# Patient Record
Sex: Female | Born: 1965 | Race: Black or African American | Hispanic: No | Marital: Single | Smoking: Current every day smoker
Health system: Southern US, Community
[De-identification: ages and names within clinical notes are randomized; demographics above are authoritative.]

## PROBLEM LIST (undated history)

## (undated) DIAGNOSIS — R7303 Prediabetes: Secondary | ICD-10-CM

## (undated) DIAGNOSIS — G43909 Migraine, unspecified, not intractable, without status migrainosus: Secondary | ICD-10-CM

## (undated) DIAGNOSIS — I10 Essential (primary) hypertension: Secondary | ICD-10-CM

## (undated) DIAGNOSIS — H5440 Blindness, one eye, unspecified eye: Secondary | ICD-10-CM

## (undated) DIAGNOSIS — S42309A Unspecified fracture of shaft of humerus, unspecified arm, initial encounter for closed fracture: Secondary | ICD-10-CM

## (undated) DIAGNOSIS — F17200 Nicotine dependence, unspecified, uncomplicated: Secondary | ICD-10-CM

## (undated) HISTORY — PX: EYE SURGERY: SHX253

---

## 1997-10-16 ENCOUNTER — Emergency Department (HOSPITAL_COMMUNITY): Admission: EM | Admit: 1997-10-16 | Discharge: 1997-10-16 | Payer: Self-pay | Admitting: Emergency Medicine

## 1997-12-07 ENCOUNTER — Emergency Department (HOSPITAL_COMMUNITY): Admission: EM | Admit: 1997-12-07 | Discharge: 1997-12-07 | Payer: Self-pay | Admitting: *Deleted

## 1998-02-06 ENCOUNTER — Encounter: Payer: Self-pay | Admitting: Emergency Medicine

## 1998-02-06 ENCOUNTER — Emergency Department (HOSPITAL_COMMUNITY): Admission: EM | Admit: 1998-02-06 | Discharge: 1998-02-06 | Payer: Self-pay | Admitting: Emergency Medicine

## 2000-01-24 ENCOUNTER — Emergency Department (HOSPITAL_COMMUNITY): Admission: EM | Admit: 2000-01-24 | Discharge: 2000-01-24 | Payer: Self-pay | Admitting: Emergency Medicine

## 2000-08-23 ENCOUNTER — Encounter: Payer: Self-pay | Admitting: Emergency Medicine

## 2000-08-23 ENCOUNTER — Emergency Department (HOSPITAL_COMMUNITY): Admission: AC | Admit: 2000-08-23 | Discharge: 2000-08-23 | Payer: Self-pay | Admitting: Emergency Medicine

## 2002-05-05 ENCOUNTER — Emergency Department (HOSPITAL_COMMUNITY): Admission: EM | Admit: 2002-05-05 | Discharge: 2002-05-05 | Payer: Self-pay | Admitting: Emergency Medicine

## 2002-11-17 ENCOUNTER — Emergency Department (HOSPITAL_COMMUNITY): Admission: AD | Admit: 2002-11-17 | Discharge: 2002-11-17 | Payer: Self-pay

## 2003-08-30 ENCOUNTER — Emergency Department (HOSPITAL_COMMUNITY): Admission: EM | Admit: 2003-08-30 | Discharge: 2003-08-30 | Payer: Self-pay | Admitting: Emergency Medicine

## 2004-01-22 ENCOUNTER — Emergency Department (HOSPITAL_COMMUNITY): Admission: EM | Admit: 2004-01-22 | Discharge: 2004-01-22 | Payer: Self-pay | Admitting: Emergency Medicine

## 2005-02-06 ENCOUNTER — Encounter: Admission: RE | Admit: 2005-02-06 | Discharge: 2005-02-06 | Payer: Self-pay | Admitting: Internal Medicine

## 2005-09-29 ENCOUNTER — Emergency Department (HOSPITAL_COMMUNITY): Admission: EM | Admit: 2005-09-29 | Discharge: 2005-09-29 | Payer: Self-pay | Admitting: Emergency Medicine

## 2010-05-11 ENCOUNTER — Encounter: Payer: Self-pay | Admitting: Internal Medicine

## 2011-09-29 ENCOUNTER — Emergency Department (HOSPITAL_COMMUNITY)
Admission: EM | Admit: 2011-09-29 | Discharge: 2011-09-30 | Disposition: A | Discharge status: Home / Self Care | Payer: Self-pay | Attending: Emergency Medicine | Admitting: Emergency Medicine

## 2011-09-29 ENCOUNTER — Encounter (HOSPITAL_COMMUNITY): Payer: Self-pay | Admitting: *Deleted

## 2011-09-29 DIAGNOSIS — E041 Nontoxic single thyroid nodule: Secondary | ICD-10-CM | POA: Insufficient documentation

## 2011-09-29 DIAGNOSIS — J029 Acute pharyngitis, unspecified: Secondary | ICD-10-CM | POA: Insufficient documentation

## 2011-09-29 DIAGNOSIS — E119 Type 2 diabetes mellitus without complications: Secondary | ICD-10-CM | POA: Insufficient documentation

## 2011-09-29 DIAGNOSIS — R1310 Dysphagia, unspecified: Secondary | ICD-10-CM | POA: Insufficient documentation

## 2011-09-29 MED ORDER — MORPHINE SULFATE 2 MG/ML IJ SOLN
2.0000 mg | Freq: Once | INTRAMUSCULAR | Status: AC
  Administered 2011-09-30: 2 mg via INTRAVENOUS
  Filled 2011-09-29: qty 1

## 2011-09-29 NOTE — ED Provider Notes (Signed)
History     CSN: [id number]  Arrival date & time 09/29/11  2035   None     Chief Complaint  Patient presents with  . Sore Throat    (Consider location/radiation/quality/duration/timing/severity/associated sxs/prior treatment) HPI  For 46 year old female with history of DM and a CC of 18 hour history of sore throat. She's had a moderate cough as well which is nonproductive. No sick contacts. Low-grade fevers at home. The left side of her throat hurts much more right side of her throat. It hurts to swallow. Pain with swallowing as 10/10. It hurts 5/10 when not swallowing. She is able to eat and drink. No trouble breathing.  Several hours before presentation she lost her voice. She denies any nausea or vomiting. She denies any headache or vision change. Denies any changes in hearing. Calls illness moderate. Nothing makes it better or worse. Temperature 98.3, pulse 96, respirations 18, blood pressure 147/92, saturation 97%.  Past Medical History  Diagnosis Date  . Diabetes mellitus     History reviewed. No pertinent past surgical history.  No family history on file.  History  Substance Use Topics  . Smoking status: Current Everyday Smoker  . Smokeless tobacco: Not on file  . Alcohol Use: Yes    OB History    Grav Para Term Preterm Abortions TAB SAB Ect Mult Living                  Review of Systems Constitutional: Negative for fever and chills.  HENT: Negative for ear pain, POS sore throat and trouble swallowing.   Eyes: Negative for pain and visual disturbance.  Respiratory: POS  for mild cough and neg shortness of breath.   Cardiovascular: Negative for chest pain and leg swelling.  Gastrointestinal: Negative for nausea, vomiting, abdominal pain and diarrhea.  Genitourinary: Negative for dysuria, urgency and frequency.  Musculoskeletal: Negative for back pain and joint swelling.  Skin: Negative for rash and wound.  Neurological: Negative for dizziness, syncope, speech  difficulty, weakness and numbness.    Allergies  Review of patient's allergies indicates no known allergies.  Home Medications   Current Outpatient Rx  Name Route Sig Dispense Refill  . DOXYCYCLINE HYCLATE 100 MG PO CAPS Oral Take 1 capsule (100 mg total) by mouth 2 (two) times daily. 20 capsule 0  . HYDROCODONE-ACETAMINOPHEN 5-500 MG PO TABS Oral Take 1 tablet by mouth every 6 (six) hours as needed for pain. 5 tablet 0  . PREDNISONE 10 MG PO TABS Oral Take 5 tablets (50 mg total) by mouth daily. 15 tablet 0    BP 131/92  Pulse 81  Temp(Src) 99.5 F (37.5 C) (Oral)  Resp 18  SpO2 100%  LMP 09/23/2011  Physical Exam Consitutional: Pt in no acute distress.   Head: Normocephalic and atraumatic.  Eyes: Extraocular motion intact, no scleral icterus OPA: Very mild hint of fullness of the left soft palate. There is asymmetry to the erythema of her tonsillar pillars as well. Uvula is midline. Neck: Supple without meningismus, mass, or overt JVDVery subtle swelling of the left anterior neck, which is TTP.  Respiratory: Effort normal and breath sounds normal. No respiratory distress. CV: Heart regular rate and rhythm, no obvious murmurs.  Pulses +2 and symmetric Abdomen: Soft, non-tender, non-distended MSK: Extremities are atraumatic without deformity, ROM intact Skin: Warm, dry, intact Neuro: Alert and oriented, no motor deficit noted.  PERRL.  Psychiatric: Mood and affect are normal     ED Course  Procedures (including  critical care time)  Labs Reviewed  CBC - Abnormal; Notable for the following:    WBC 10.9 (*)    RBC 3.60 (*)    Hemoglobin 11.8 (*)    HCT 34.3 (*)    All other components within normal limits  DIFFERENTIAL - Abnormal; Notable for the following:    Monocytes Relative 14 (*)    Monocytes Absolute 1.5 (*)    All other components within normal limits  RAPID STREP SCREEN  BASIC METABOLIC PANEL   Ct Soft Tissue Neck W Contrast  09/30/2011  *RADIOLOGY  REPORT*  Clinical Data: Difficulty swallowing.  CT NECK WITH CONTRAST  Technique:  Multidetector CT imaging of the neck was performed with intravenous contrast.  Contrast:  75 ml Omnipaque-300  Comparison: None.  Findings: Limited images through the posterior fossa show no acute abnormality. Linear density along the lateral margin of the left globe is presumably postsurgical. Lung apices are clear.  Left tonsillar enlargement and peritonsillar stranding, with spread to the left lingual tonsil where there is a subcentimeter peripheral enhancing fluid collection on image 34 of series 4. The process spreads laterally to involve left parapharyngeal and carotid spaces. There is associated mass effect on the oropharyngeal airway.  The left vallecula and piriform sinus is obliterated.  There are prominent anterior cervical chain and submandibular lymph nodes.  Subcentimeter nodule within the right lobe the thyroid gland.  The jugular vein becomes quite diminutive as it courses posterior to the styloid process.  There are prominent collateral vessels stranding the carotid artery, asymmetric on the left.  No complete vessel occlusion identified.  No acute osseous finding. Degenerative disc disease centered at C5- 6.  IMPRESSION: Infiltrative tonsillar/peritonsillar process as described above, with mass effect on the oropharyngeal airway and obliteration of the left valleculae and piriform sinus. Infiltration extends into the left parapharyngeal and carotid spaces.  This may reflect an advanced infectious process involving multiple spaces.  However, I cannot exclude malignancy. Recommend ENT consultation.  More focal loculated fluid collection in the region of the lingual tonsil may represent an abscess.  Prominent lymph nodes, left greater than right.  Discussed via telephone with Dr. Rainey Pines at 02:00 a.m. on 09/30/2011. I will have a neuroradiologist review the case as well in the a.m. and addend if necessary.  Original  Report Authenticated By: Waneta Martins, M.D.     1. Sore throat       MDM  Patient is afebrile upon exam however she has lost her voice we have to assume this is likely a bronchitis. However of concern is there is a very very mild fullness of the left soft palate.  Given subtle fullness of l L neck, concern for abscess. We'll complete CT scan of the neck to rule out abscess in that area unlikely the patient on antibiotics as well for presumed infection.  Despite her very minimal external exam findings, CT scan demonstrates a large area of infiltrative disease of the several neck structures including the lingual tonsil, peritonsillar structures.  Her airway remains completely intact with no stridor or sense of shortness of SOB per the patient.   ENT consulted by phone. Differential diagnosis includes neoplasm versus infection. Patient will be started on oral antibiotics oral steroids he'll followup with ENT within a week. Patient given Decadron and Unasyn here. Patient to followup with prednisone and doxycycline until seen in the next several days by Dr. Emeline Darling. Given strict return precautions.  PT DC home stable.  Discussed with  pt the clinical impression, treatment in the ED, and follow up plan.  We alslo discussed the indications for returning to the ED, which include shortness or breath, confusion, fever, new weakness or numbness, chest pain, or any other concerning symptom.  The pt understood the treatment and plan, is stable, and is able to leave the ED.            Larrie Kass, MD 09/30/11 [phone]

## 2011-09-29 NOTE — ED Notes (Signed)
She has ha sorethroat since yesterday.  With some lt face swelling

## 2011-09-29 NOTE — ED Notes (Addendum)
Pt presented to ED with sore throat and tenderness since 2 days.No complaints of chills or rigor.Pt complains of difficulty in swallowing.Oral tem of 99.5.She also complaints of headache in the left side.

## 2011-09-30 ENCOUNTER — Emergency Department (HOSPITAL_COMMUNITY): Payer: Self-pay

## 2011-09-30 LAB — BASIC METABOLIC PANEL
BUN: 8 mg/dL (ref 6–23)
CO2: 22 mEq/L (ref 19–32)
Calcium: 9.1 mg/dL (ref 8.4–10.5)
Chloride: 101 mEq/L (ref 96–112)
Glucose, Bld: 87 mg/dL (ref 70–99)

## 2011-09-30 LAB — CBC
HCT: 34.3 % — ABNORMAL LOW (ref 36.0–46.0)
Hemoglobin: 11.8 g/dL — ABNORMAL LOW (ref 12.0–15.0)
WBC: 10.9 10*3/uL — ABNORMAL HIGH (ref 4.0–10.5)

## 2011-09-30 LAB — DIFFERENTIAL
Lymphocytes Relative: 16 % (ref 12–46)
Lymphs Abs: 1.8 10*3/uL (ref 0.7–4.0)
Monocytes Absolute: 1.5 10*3/uL — ABNORMAL HIGH (ref 0.1–1.0)
Monocytes Relative: 14 % — ABNORMAL HIGH (ref 3–12)
Neutro Abs: 7.5 10*3/uL (ref 1.7–7.7)

## 2011-09-30 MED ORDER — HYDROCODONE-ACETAMINOPHEN 5-500 MG PO TABS: 1.0000 | ORAL_TABLET | Freq: Four times a day (QID) | ORAL | Status: AC | PRN

## 2011-09-30 MED ORDER — SODIUM CHLORIDE 0.9 % IV SOLN
3.0000 g | Freq: Once | INTRAVENOUS | Status: AC
  Administered 2011-09-30: 3 g via INTRAVENOUS
  Filled 2011-09-30: qty 3

## 2011-09-30 MED ORDER — IOHEXOL 300 MG/ML  SOLN: 80.0000 mL | Freq: Once | INTRAMUSCULAR | Status: DC | PRN

## 2011-09-30 MED ORDER — DOXYCYCLINE HYCLATE 100 MG PO CAPS: 100.0000 mg | ORAL_CAPSULE | Freq: Two times a day (BID) | ORAL | Status: AC

## 2011-09-30 MED ORDER — DEXAMETHASONE SODIUM PHOSPHATE 10 MG/ML IJ SOLN
10.0000 mg | Freq: Once | INTRAMUSCULAR | Status: AC
  Administered 2011-09-30: 10 mg via INTRAVENOUS
  Filled 2011-09-30: qty 1

## 2011-09-30 MED ORDER — PREDNISONE 10 MG PO TABS: 50.0000 mg | ORAL_TABLET | Freq: Every day | ORAL | Status: AC

## 2011-09-30 MED ORDER — OXYCODONE-ACETAMINOPHEN 5-325 MG PO TABS
2.0000 | ORAL_TABLET | Freq: Once | ORAL | Status: AC
  Administered 2011-09-30: 2 via ORAL
  Filled 2011-09-30: qty 2

## 2011-09-30 NOTE — ED Notes (Signed)
Complaining of pain with swallowing. Left side face swelling

## 2011-09-30 NOTE — Discharge Instructions (Signed)
You need to follow up with ENT Dr. Emeline Darling to better figure out and treat what is causing your sore throat.  THIS IS IMPORTANT.  Take all of your medicines as directed.  Return to the emergency department with any difficulty breathing or strange sounds while breathing.  See your doctor immediately--or return to the ED--with any new or troubling symptoms including fevers, weakness, new chest pain, shortness or breath, numbness, or any other concerning symptom.  Sore Throat    Sore throats may be caused by bacteria and viruses. They may also be caused by:  Smoking.   Pollution.   Allergies.  If a sore throat is due to strep infection (a bacterial infection), you may need:  A throat swab.   A culture test to verify the strep infection.  You will need one of these:  An antibiotic shot.   Oral medicine for a full 10 days.  Strep infection is very contagious. A doctor should check any close contacts who have a sore throat or fever. A sore throat caused by a virus infection will usually last only 3-4 days. Antibiotics will not treat a viral sore throat.  Infectious mononucleosis (a viral disease), however, can cause a sore throat that lasts for up to 3 weeks. Mononucleosis can be diagnosed with blood tests. You must have been sick for at least 1 week in order for the test to give accurate results. HOME CARE INSTRUCTIONS   To treat a sore throat, take mild pain medicine.   Increase your fluids.   Eat a soft diet.   Do not smoke.   Gargling with warm water or salt water (1 tsp. salt in 8 oz. water) can be helpful.   Try throat sprays or lozenges or sucking on hard candy to ease the symptoms.  Call your doctor if your sore throat lasts longer than 1 week.  SEEK IMMEDIATE MEDICAL CARE IF:  You have difficulty breathing.   You have increased swelling in the throat.   You have pain so severe that you are unable to swallow fluids or your saliva.   You have a severe headache, a high fever,  vomiting, or a red rash.  Document Released: 05/15/2004 Document Revised: 03/27/2011 Document Reviewed: 03/25/2007 [facility] Patient Information 2012 [place], [place].

## 2011-10-03 NOTE — ED Provider Notes (Signed)
.  Face to face Exam:  General:  A&Ox3 HEENT:  Atraumatic Resp:  Normal effort Abd:  Nondistended Neuro:No focal deficits     Nelia Shi, MD 10/03/11 1112

## 2013-06-22 ENCOUNTER — Encounter (HOSPITAL_COMMUNITY): Payer: Self-pay | Admitting: Emergency Medicine

## 2013-06-22 ENCOUNTER — Emergency Department (HOSPITAL_COMMUNITY)
Admission: EM | Admit: 2013-06-22 | Discharge: 2013-06-22 | Disposition: A | Discharge status: Home / Self Care | Payer: Self-pay | Attending: Emergency Medicine | Admitting: Emergency Medicine

## 2013-06-22 DIAGNOSIS — R51 Headache: Secondary | ICD-10-CM | POA: Insufficient documentation

## 2013-06-22 DIAGNOSIS — R519 Headache, unspecified: Secondary | ICD-10-CM

## 2013-06-22 DIAGNOSIS — F17200 Nicotine dependence, unspecified, uncomplicated: Secondary | ICD-10-CM | POA: Insufficient documentation

## 2013-06-22 DIAGNOSIS — E119 Type 2 diabetes mellitus without complications: Secondary | ICD-10-CM | POA: Insufficient documentation

## 2013-06-22 DIAGNOSIS — Z8679 Personal history of other diseases of the circulatory system: Secondary | ICD-10-CM | POA: Insufficient documentation

## 2013-06-22 MED ORDER — METOCLOPRAMIDE HCL 5 MG/ML IJ SOLN
10.0000 mg | Freq: Once | INTRAMUSCULAR | Status: AC
  Administered 2013-06-22: 10 mg via INTRAMUSCULAR
  Filled 2013-06-22: qty 2

## 2013-06-22 MED ORDER — DIPHENHYDRAMINE HCL 50 MG/ML IJ SOLN
25.0000 mg | Freq: Once | INTRAMUSCULAR | Status: AC
Start: 2013-06-22 — End: 2013-06-22
  Administered 2013-06-22: 25 mg via INTRAMUSCULAR
  Filled 2013-06-22: qty 1

## 2013-06-22 MED ORDER — KETOROLAC TROMETHAMINE 60 MG/2ML IM SOLN
60.0000 mg | Freq: Once | INTRAMUSCULAR | Status: AC
Start: 2013-06-22 — End: 2013-06-22
  Administered 2013-06-22: 60 mg via INTRAMUSCULAR
  Filled 2013-06-22: qty 2

## 2013-06-22 NOTE — ED Provider Notes (Signed)
Medical screening examination/treatment/procedure(s) were performed by non-physician practitioner and as supervising physician I was immediately available for consultation/collaboration.   EKG Interpretation None      Devoria AlbeIva Melyna Huron, MD, Armando GangFACEP   Ward GivensIva L Marti Mclane, MD 06/22/13 [phone]

## 2013-06-22 NOTE — ED Notes (Signed)
Left ear pain that hurts up neck to eye since yesterday

## 2013-06-22 NOTE — Discharge Instructions (Signed)
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
## 2013-06-27 ENCOUNTER — Emergency Department (HOSPITAL_COMMUNITY): Payer: Self-pay

## 2013-06-27 ENCOUNTER — Emergency Department (HOSPITAL_COMMUNITY)
Admission: EM | Admit: 2013-06-27 | Discharge: 2013-06-27 | Disposition: A | Discharge status: Home / Self Care | Payer: Self-pay | Attending: Emergency Medicine | Admitting: Emergency Medicine

## 2013-06-27 ENCOUNTER — Encounter (HOSPITAL_COMMUNITY): Payer: Self-pay | Admitting: Emergency Medicine

## 2013-06-27 DIAGNOSIS — F17200 Nicotine dependence, unspecified, uncomplicated: Secondary | ICD-10-CM | POA: Insufficient documentation

## 2013-06-27 DIAGNOSIS — H5789 Other specified disorders of eye and adnexa: Secondary | ICD-10-CM | POA: Insufficient documentation

## 2013-06-27 DIAGNOSIS — R51 Headache: Secondary | ICD-10-CM | POA: Insufficient documentation

## 2013-06-27 DIAGNOSIS — E119 Type 2 diabetes mellitus without complications: Secondary | ICD-10-CM | POA: Insufficient documentation

## 2013-06-27 DIAGNOSIS — R519 Headache, unspecified: Secondary | ICD-10-CM

## 2013-06-27 DIAGNOSIS — Z8679 Personal history of other diseases of the circulatory system: Secondary | ICD-10-CM | POA: Insufficient documentation

## 2013-06-27 HISTORY — DX: Migraine, unspecified, not intractable, without status migrainosus: G43.909

## 2013-06-27 LAB — SEDIMENTATION RATE: SED RATE: 19 mm/h (ref 0–22)

## 2013-06-27 MED ORDER — HYDROMORPHONE HCL PF 1 MG/ML IJ SOLN
0.5000 mg | Freq: Once | INTRAMUSCULAR | Status: AC
  Administered 2013-06-27: 0.5 mg via INTRAVENOUS
  Filled 2013-06-27: qty 1

## 2013-06-27 MED ORDER — HYDROCODONE-ACETAMINOPHEN 5-325 MG PO TABS: 1.0000 | ORAL_TABLET | ORAL | Status: DC | PRN

## 2013-06-27 MED ORDER — ONDANSETRON HCL 4 MG/2ML IJ SOLN
4.0000 mg | Freq: Once | INTRAMUSCULAR | Status: AC
  Administered 2013-06-27: 4 mg via INTRAVENOUS
  Filled 2013-06-27: qty 2

## 2013-06-27 MED ORDER — KETOROLAC TROMETHAMINE 15 MG/ML IJ SOLN
INTRAMUSCULAR | Status: DC
Start: 2013-06-27 — End: 2013-06-27
  Filled 2013-06-27: qty 2

## 2013-06-27 MED ORDER — KETOROLAC TROMETHAMINE 15 MG/ML IJ SOLN
30.0000 mg | Freq: Once | INTRAMUSCULAR | Status: AC
  Administered 2013-06-27: 30 mg via INTRAVENOUS

## 2013-06-27 MED ORDER — METOCLOPRAMIDE HCL 5 MG/ML IJ SOLN
10.0000 mg | Freq: Once | INTRAMUSCULAR | Status: AC
Start: 2013-06-27 — End: 2013-06-27
  Administered 2013-06-27: 10 mg via INTRAVENOUS
  Filled 2013-06-27: qty 2

## 2013-06-27 MED ORDER — DIPHENHYDRAMINE HCL 50 MG/ML IJ SOLN
12.5000 mg | Freq: Once | INTRAMUSCULAR | Status: AC
  Administered 2013-06-27: 12.5 mg via INTRAVENOUS
  Filled 2013-06-27: qty 1

## 2013-06-27 NOTE — ED Notes (Signed)
Pt is c/o of headache in her left temple and behind left eye.  Pt rates pain 10/10 throbbing in nature. +photophobia.  Pt states pain started 06/26/13 at 2000.  Pt states she took Excedrin at 2300 w/o relief.  Pt is A&O x3.
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
## 2013-06-27 NOTE — ED Provider Notes (Signed)
Medical screening examination/treatment/procedure(s) were performed by non-physician practitioner and as supervising physician I was immediately available for consultation/collaboration.  Maribeth Jiles L Annastacia Duba, MD 06/27/13 1635 

## 2013-06-27 NOTE — Progress Notes (Signed)
[facility] provided pt with a list of primary care resources and a [facility] [employer] Card application to help patient establish primary care.  °

## 2013-06-27 NOTE — Discharge Instructions (Signed)

## 2013-06-27 NOTE — ED Notes (Signed)
Patient transported to CT 

## 2013-08-23 ENCOUNTER — Ambulatory Visit: Payer: Self-pay | Attending: Internal Medicine

## 2015-01-24 ENCOUNTER — Encounter (HOSPITAL_COMMUNITY): Payer: Self-pay | Admitting: Emergency Medicine

## 2015-01-24 ENCOUNTER — Emergency Department (HOSPITAL_COMMUNITY)
Admission: EM | Admit: 2015-01-24 | Discharge: 2015-01-24 | Disposition: A | Discharge status: Home / Self Care | Payer: Self-pay | Attending: Emergency Medicine | Admitting: Emergency Medicine

## 2015-01-24 DIAGNOSIS — G43909 Migraine, unspecified, not intractable, without status migrainosus: Secondary | ICD-10-CM | POA: Insufficient documentation

## 2015-01-24 DIAGNOSIS — H109 Unspecified conjunctivitis: Secondary | ICD-10-CM

## 2015-01-24 DIAGNOSIS — Z720 Tobacco use: Secondary | ICD-10-CM | POA: Insufficient documentation

## 2015-01-24 DIAGNOSIS — Z87828 Personal history of other (healed) physical injury and trauma: Secondary | ICD-10-CM | POA: Insufficient documentation

## 2015-01-24 DIAGNOSIS — E119 Type 2 diabetes mellitus without complications: Secondary | ICD-10-CM | POA: Insufficient documentation

## 2015-01-24 DIAGNOSIS — H5442 Blindness, left eye, normal vision right eye: Secondary | ICD-10-CM | POA: Insufficient documentation

## 2015-01-24 HISTORY — DX: Blindness, one eye, unspecified eye: H54.40

## 2015-01-24 MED ORDER — ERYTHROMYCIN 5 MG/GM OP OINT: 1.0000 "application " | TOPICAL_OINTMENT | Freq: Four times a day (QID) | OPHTHALMIC | Status: DC
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
## 2015-01-24 NOTE — ED Notes (Signed)
Pt. woke up this morning with left eye redness , itchy/painful with drainage . Denies injury.

## 2015-01-24 NOTE — Discharge Instructions (Signed)
Conjunctivitis is very contagious. Try to avoid rubbing eyes. Apply warm compresses and use prescribed eye ointment as directed. Follow up with your eye doctor in 2-3 days. Return to the ER for changes or worsening symptoms   Bacterial Conjunctivitis Bacterial conjunctivitis, commonly called pink eye, is an inflammation of the clear membrane that covers the white part of the eye (conjunctiva). The inflammation can also happen on the underside of the eyelids. The blood vessels in the conjunctiva become inflamed, causing the eye to become red or pink. Bacterial conjunctivitis may spread easily from one eye to another and from person to person (contagious).  CAUSES  Bacterial conjunctivitis is caused by bacteria. The bacteria may come from your own skin, your upper respiratory tract, or from someone else with bacterial conjunctivitis. SYMPTOMS  The normally white color of the eye or the underside of the eyelid is usually pink or red. The pink eye is usually associated with irritation, tearing, and some sensitivity to light. Bacterial conjunctivitis is often associated with a thick, yellowish discharge from the eye. The discharge may turn into a crust on the eyelids overnight, which causes your eyelids to stick together. If a discharge is present, there may also be some blurred vision in the affected eye. DIAGNOSIS  Bacterial conjunctivitis is diagnosed by your caregiver through an eye exam and the symptoms that you report. Your caregiver looks for changes in the surface tissues of your eyes, which may point to the specific type of conjunctivitis. A sample of any discharge may be collected on a cotton-tip swab if you have a severe case of conjunctivitis, if your cornea is affected, or if you keep getting repeat infections that do not respond to treatment. The sample will be sent to a lab to see if the inflammation is caused by a bacterial infection and to see if the infection will respond to antibiotic  medicines. TREATMENT   Bacterial conjunctivitis is treated with antibiotics. Antibiotic eyedrops are most often used. However, antibiotic ointments are also available. Antibiotics pills are sometimes used. Artificial tears or eye washes may ease discomfort. HOME CARE INSTRUCTIONS   To ease discomfort, apply a cool, clean washcloth to your eye for 10-20 minutes, 3-4 times a day.  Gently wipe away any drainage from your eye with a warm, wet washcloth or a cotton ball.  Wash your hands often with soap and water. Use paper towels to dry your hands.  Do not share towels or washcloths. This may spread the infection.  Change or wash your pillowcase every day.  You should not use eye makeup until the infection is gone.  Do not operate machinery or drive if your vision is blurred.  Stop using contact lenses. Ask your caregiver how to sterilize or replace your contacts before using them again. This depends on the type of contact lenses that you use.  When applying medicine to the infected eye, do not touch the edge of your eyelid with the eyedrop bottle or ointment tube. SEEK IMMEDIATE MEDICAL CARE IF:   Your infection has not improved within 3 days after beginning treatment.  You had yellow discharge from your eye and it returns.  You have increased eye pain.  Your eye redness is spreading.  Your vision becomes blurred.  You have a fever or persistent symptoms for more than 2-3 days.  You have a fever and your symptoms suddenly get worse.  You have facial pain, redness, or swelling. MAKE SURE YOU:   Understand these instructions.  Will  watch your condition.  Will get help right away if you are not doing well or get worse.   This information is not intended to replace advice given to you by your health care provider. Make sure you discuss any questions you have with your health care provider.   Document Released: 04/07/2005 Document Revised: 04/28/2014 Document Reviewed:  09/08/2011 Elsevier Interactive Patient Education [employer]

## 2015-12-08 ENCOUNTER — Emergency Department (HOSPITAL_COMMUNITY): Payer: Self-pay

## 2015-12-08 ENCOUNTER — Encounter (HOSPITAL_COMMUNITY): Payer: Self-pay | Admitting: Emergency Medicine

## 2015-12-08 ENCOUNTER — Emergency Department (HOSPITAL_COMMUNITY)
Admission: EM | Admit: 2015-12-08 | Discharge: 2015-12-08 | Disposition: A | Discharge status: Home / Self Care | Payer: Self-pay | Attending: Emergency Medicine | Admitting: Emergency Medicine

## 2015-12-08 DIAGNOSIS — R1032 Left lower quadrant pain: Secondary | ICD-10-CM | POA: Insufficient documentation

## 2015-12-08 DIAGNOSIS — E119 Type 2 diabetes mellitus without complications: Secondary | ICD-10-CM | POA: Insufficient documentation

## 2015-12-08 DIAGNOSIS — F17210 Nicotine dependence, cigarettes, uncomplicated: Secondary | ICD-10-CM | POA: Insufficient documentation

## 2015-12-08 DIAGNOSIS — Z7982 Long term (current) use of aspirin: Secondary | ICD-10-CM | POA: Insufficient documentation

## 2015-12-08 DIAGNOSIS — K529 Noninfective gastroenteritis and colitis, unspecified: Secondary | ICD-10-CM | POA: Insufficient documentation

## 2015-12-08 LAB — COMPREHENSIVE METABOLIC PANEL
ALK PHOS: 54 U/L (ref 38–126)
ALT: 26 U/L (ref 14–54)
AST: 22 U/L (ref 15–41)
Albumin: 3.1 g/dL — ABNORMAL LOW (ref 3.5–5.0)
Anion gap: 10 (ref 5–15)
CALCIUM: 8.5 mg/dL — AB (ref 8.9–10.3)
CHLORIDE: 101 mmol/L (ref 101–111)
CO2: 23 mmol/L (ref 22–32)
CREATININE: 0.62 mg/dL (ref 0.44–1.00)
GFR calc Af Amer: >60 mL/min (ref >60)
Glucose, Bld: 88 mg/dL (ref 65–99)
Potassium: 2.8 mmol/L — ABNORMAL LOW (ref 3.5–5.1)
Sodium: 134 mmol/L — ABNORMAL LOW (ref 135–145)
Total Bilirubin: 0.7 mg/dL (ref 0.3–1.2)
Total Protein: 6.4 g/dL — ABNORMAL LOW (ref 6.5–8.1)

## 2015-12-08 LAB — CBC
HCT: 36.8 % (ref 36.0–46.0)
Hemoglobin: 12.4 g/dL (ref 12.0–15.0)
MCH: 31.7 pg (ref 26.0–34.0)
MCHC: 33.7 g/dL (ref 30.0–36.0)
MCV: 94.1 fL (ref 78.0–100.0)
PLATELETS: 394 10*3/uL (ref 150–400)
RBC: 3.91 MIL/uL (ref 3.87–5.11)
RDW: 13.7 % (ref 11.5–15.5)
WBC: 10 10*3/uL (ref 4.0–10.5)

## 2015-12-08 LAB — URINALYSIS, ROUTINE W REFLEX MICROSCOPIC
GLUCOSE, UA: NEGATIVE mg/dL
HGB URINE DIPSTICK: NEGATIVE
Nitrite: NEGATIVE
PROTEIN: 30 mg/dL — AB
Specific Gravity, Urine: 1.035 — ABNORMAL HIGH (ref 1.005–1.030)
pH: 6 (ref 5.0–8.0)

## 2015-12-08 LAB — URINE MICROSCOPIC-ADD ON: RBC / HPF: NONE SEEN RBC/hpf (ref 0–5)

## 2015-12-08 LAB — LIPASE, BLOOD: LIPASE: 15 U/L (ref 11–51)

## 2015-12-08 MED ORDER — METRONIDAZOLE 500 MG PO TABS: 500.0000 mg | ORAL_TABLET | Freq: Two times a day (BID) | ORAL | 0 refills | Status: DC

## 2015-12-08 MED ORDER — MAGNESIUM OXIDE 400 (241.3 MG) MG PO TABS
800.0000 mg | ORAL_TABLET | Freq: Once | ORAL | Status: AC
  Administered 2015-12-08: 800 mg via ORAL
  Filled 2015-12-08: qty 2

## 2015-12-08 MED ORDER — SODIUM CHLORIDE 0.9 % IV BOLUS (SEPSIS)
1000.0000 mL | Freq: Once | INTRAVENOUS | Status: AC
  Administered 2015-12-08: 1000 mL via INTRAVENOUS

## 2015-12-08 MED ORDER — CIPROFLOXACIN HCL 500 MG PO TABS: 500.0000 mg | ORAL_TABLET | Freq: Two times a day (BID) | ORAL | 0 refills | Status: DC

## 2015-12-08 MED ORDER — METRONIDAZOLE 500 MG PO TABS
500.0000 mg | ORAL_TABLET | Freq: Once | ORAL | Status: AC
  Administered 2015-12-08: 500 mg via ORAL
  Filled 2015-12-08: qty 1

## 2015-12-08 MED ORDER — CIPROFLOXACIN HCL 500 MG PO TABS
500.0000 mg | ORAL_TABLET | Freq: Once | ORAL | Status: AC
  Administered 2015-12-08: 500 mg via ORAL
  Filled 2015-12-08: qty 1

## 2015-12-08 MED ORDER — POTASSIUM CHLORIDE 10 MEQ/100ML IV SOLN
10.0000 meq | Freq: Once | INTRAVENOUS | Status: AC
  Administered 2015-12-08: 10 meq via INTRAVENOUS
  Filled 2015-12-08: qty 100

## 2015-12-08 MED ORDER — POTASSIUM CHLORIDE CRYS ER 20 MEQ PO TBCR
80.0000 meq | EXTENDED_RELEASE_TABLET | Freq: Once | ORAL | Status: AC
  Administered 2015-12-08: 80 meq via ORAL
  Filled 2015-12-08: qty 4

## 2015-12-08 MED ORDER — IOPAMIDOL (ISOVUE-300) INJECTION 61%
INTRAVENOUS | Status: AC
  Administered 2015-12-08: 100 mL
  Filled 2015-12-08: qty 100

## 2015-12-08 MED ORDER — MORPHINE SULFATE (PF) 4 MG/ML IV SOLN
4.0000 mg | Freq: Once | INTRAVENOUS | Status: AC
  Administered 2015-12-08: 4 mg via INTRAVENOUS
  Filled 2015-12-08: qty 1

## 2015-12-08 NOTE — ED Notes (Signed)
Pt dc home with mother via wc

## 2015-12-08 NOTE — ED Notes (Signed)
MD at bedside. 
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
## 2015-12-08 NOTE — ED Notes (Signed)
Pt back from CT

## 2015-12-08 NOTE — ED Notes (Signed)
Provider at bedside

## 2015-12-08 NOTE — ED Notes (Signed)
Pt to CT

## 2015-12-08 NOTE — ED Notes (Signed)
Pt unable to take PO meds at this time r/t nausea at this time.

## 2015-12-08 NOTE — ED Triage Notes (Signed)
Pt c/o diarrhea and weakness ongoing for about 2 weeks.

## 2015-12-26 ENCOUNTER — Encounter (HOSPITAL_COMMUNITY): Payer: Self-pay | Admitting: Emergency Medicine

## 2015-12-26 ENCOUNTER — Emergency Department (HOSPITAL_COMMUNITY): Payer: Self-pay

## 2015-12-26 ENCOUNTER — Emergency Department (HOSPITAL_COMMUNITY)
Admission: EM | Admit: 2015-12-26 | Discharge: 2015-12-26 | Disposition: A | Discharge status: Home / Self Care | Payer: Self-pay | Attending: Emergency Medicine | Admitting: Emergency Medicine

## 2015-12-26 DIAGNOSIS — K6289 Other specified diseases of anus and rectum: Secondary | ICD-10-CM | POA: Insufficient documentation

## 2015-12-26 DIAGNOSIS — R197 Diarrhea, unspecified: Secondary | ICD-10-CM

## 2015-12-26 DIAGNOSIS — E119 Type 2 diabetes mellitus without complications: Secondary | ICD-10-CM | POA: Insufficient documentation

## 2015-12-26 DIAGNOSIS — F17210 Nicotine dependence, cigarettes, uncomplicated: Secondary | ICD-10-CM | POA: Insufficient documentation

## 2015-12-26 DIAGNOSIS — Z7982 Long term (current) use of aspirin: Secondary | ICD-10-CM | POA: Insufficient documentation

## 2015-12-26 LAB — CBC
HCT: 39.1 % (ref 36.0–46.0)
HEMOGLOBIN: 13.3 g/dL (ref 12.0–15.0)
MCH: 32.3 pg (ref 26.0–34.0)
MCHC: 34 g/dL (ref 30.0–36.0)
MCV: 94.9 fL (ref 78.0–100.0)
Platelets: 351 10*3/uL (ref 150–400)
RBC: 4.12 MIL/uL (ref 3.87–5.11)
RDW: 14.4 % (ref 11.5–15.5)
WBC: 13.1 10*3/uL — AB (ref 4.0–10.5)

## 2015-12-26 LAB — URINALYSIS, ROUTINE W REFLEX MICROSCOPIC
Glucose, UA: NEGATIVE mg/dL
Hgb urine dipstick: NEGATIVE
Leukocytes, UA: NEGATIVE
NITRITE: NEGATIVE
Protein, ur: NEGATIVE mg/dL
Specific Gravity, Urine: 1.02 (ref 1.005–1.030)
pH: 7.5 (ref 5.0–8.0)

## 2015-12-26 LAB — COMPREHENSIVE METABOLIC PANEL
ALBUMIN: 3.5 g/dL (ref 3.5–5.0)
ALK PHOS: 59 U/L (ref 38–126)
ALT: 25 U/L (ref 14–54)
ANION GAP: 11 (ref 5–15)
AST: 15 U/L (ref 15–41)
BILIRUBIN TOTAL: 0.7 mg/dL (ref 0.3–1.2)
CALCIUM: 9.2 mg/dL (ref 8.9–10.3)
CO2: 25 mmol/L (ref 22–32)
Chloride: 101 mmol/L (ref 101–111)
Creatinine, Ser: 0.59 mg/dL (ref 0.44–1.00)
GFR calc Af Amer: >60 mL/min (ref >60)
GLUCOSE: 100 mg/dL — AB (ref 65–99)
Potassium: 3 mmol/L — ABNORMAL LOW (ref 3.5–5.1)
Sodium: 137 mmol/L (ref 135–145)
TOTAL PROTEIN: 7.1 g/dL (ref 6.5–8.1)

## 2015-12-26 LAB — I-STAT CG4 LACTIC ACID, ED
LACTIC ACID, VENOUS: 1.01 mmol/L (ref 0.5–1.9)
Lactic Acid, Venous: 1.1 mmol/L (ref 0.5–1.9)

## 2015-12-26 LAB — LIPASE, BLOOD: Lipase: 16 U/L (ref 11–51)

## 2015-12-26 MED ORDER — IOPAMIDOL (ISOVUE-300) INJECTION 61%
INTRAVENOUS | Status: AC
  Administered 2015-12-26: 100 mL
  Filled 2015-12-26: qty 100

## 2015-12-26 MED ORDER — HYDROCODONE-ACETAMINOPHEN 5-325 MG PO TABS: 1.0000 | ORAL_TABLET | ORAL | 0 refills | Status: DC | PRN

## 2015-12-26 MED ORDER — HYDROMORPHONE HCL 1 MG/ML IJ SOLN
1.0000 mg | Freq: Once | INTRAMUSCULAR | Status: AC
  Administered 2015-12-26: 1 mg via INTRAVENOUS
  Filled 2015-12-26: qty 1

## 2015-12-26 MED ORDER — METRONIDAZOLE 500 MG PO TABS: 500.0000 mg | ORAL_TABLET | Freq: Three times a day (TID) | ORAL | 0 refills | Status: AC

## 2015-12-26 MED ORDER — SODIUM CHLORIDE 0.9 % IV BOLUS (SEPSIS)
1000.0000 mL | Freq: Once | INTRAVENOUS | Status: AC
  Administered 2015-12-26: 1000 mL via INTRAVENOUS

## 2015-12-26 MED ORDER — PROBIOTIC 250 MG PO CAPS: 1.0000 | ORAL_CAPSULE | Freq: Every day | ORAL | 0 refills | Status: DC

## 2015-12-26 MED ORDER — ONDANSETRON HCL 4 MG PO TABS: 4.0000 mg | ORAL_TABLET | Freq: Four times a day (QID) | ORAL | 0 refills | Status: DC | PRN

## 2015-12-26 MED ORDER — ONDANSETRON HCL 4 MG/2ML IJ SOLN
4.0000 mg | Freq: Once | INTRAMUSCULAR | Status: AC
  Administered 2015-12-26: 4 mg via INTRAVENOUS
  Filled 2015-12-26: qty 2
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
## 2015-12-26 NOTE — ED Triage Notes (Signed)
Pt states she was seen here twow eeks ago and was told she had bacteria in her colon, was given antibiotics and took them all the way, she felt better but the symptoms came back again. Pt c/o diarrhea, stomach hurting, states she has poor appetite. Pt also c/o vomiting.

## 2015-12-26 NOTE — ED Notes (Signed)
MD at bedside. 

## 2015-12-26 NOTE — ED Notes (Signed)
Patient transported to CT 

## 2015-12-27 ENCOUNTER — Telehealth (HOSPITAL_COMMUNITY): Payer: Self-pay

## 2015-12-27 LAB — C DIFFICILE QUICK SCREEN W PCR REFLEX
C Diff antigen: NEGATIVE
C Diff toxin: POSITIVE — AB

## 2015-12-27 LAB — CLOSTRIDIUM DIFFICILE BY PCR: Toxigenic C. Difficile by PCR: POSITIVE — AB

## 2015-12-27 NOTE — Telephone Encounter (Signed)
Lab calling stating do not have enough stool for GI panel but have enough for C Diff.  Call transferred to ordering provider J. Cristi LoronBeatty Res. for further instruction

## 2016-01-08 ENCOUNTER — Ambulatory Visit: Payer: Self-pay | Attending: Internal Medicine | Admitting: Internal Medicine

## 2016-01-08 ENCOUNTER — Other Ambulatory Visit: Payer: Self-pay | Admitting: Pharmacist

## 2016-01-08 VITALS — BP 157/90 | HR 71 | Temp 99.1°F | Resp 16 | Wt 136.4 lb

## 2016-01-08 DIAGNOSIS — Z23 Encounter for immunization: Secondary | ICD-10-CM

## 2016-01-08 DIAGNOSIS — A0472 Enterocolitis due to Clostridium difficile, not specified as recurrent: Secondary | ICD-10-CM

## 2016-01-08 DIAGNOSIS — Z114 Encounter for screening for human immunodeficiency virus [HIV]: Secondary | ICD-10-CM | POA: Insufficient documentation

## 2016-01-08 DIAGNOSIS — F17200 Nicotine dependence, unspecified, uncomplicated: Secondary | ICD-10-CM

## 2016-01-08 DIAGNOSIS — A047 Enterocolitis due to Clostridium difficile: Secondary | ICD-10-CM

## 2016-01-08 DIAGNOSIS — Z1329 Encounter for screening for other suspected endocrine disorder: Secondary | ICD-10-CM | POA: Insufficient documentation

## 2016-01-08 DIAGNOSIS — Z1159 Encounter for screening for other viral diseases: Secondary | ICD-10-CM | POA: Insufficient documentation

## 2016-01-08 DIAGNOSIS — Z0000 Encounter for general adult medical examination without abnormal findings: Secondary | ICD-10-CM | POA: Insufficient documentation

## 2016-01-08 DIAGNOSIS — E876 Hypokalemia: Secondary | ICD-10-CM | POA: Insufficient documentation

## 2016-01-08 LAB — BASIC METABOLIC PANEL WITH GFR
BUN: 7 mg/dL (ref 7–25)
CO2: 25 mmol/L (ref 20–31)
Calcium: 9.1 mg/dL (ref 8.6–10.4)
Chloride: 105 mmol/L (ref 98–110)
Creat: 0.52 mg/dL (ref 0.50–1.05)
GFR, Est Non African American: >89 mL/min (ref >=60)
GLUCOSE: 83 mg/dL (ref 65–99)
POTASSIUM: 3.8 mmol/L (ref 3.5–5.3)
SODIUM: 137 mmol/L (ref 135–146)

## 2016-01-08 LAB — TSH: TSH: 0.99 mIU/L

## 2016-01-08 MED ORDER — HYDROCHLOROTHIAZIDE 12.5 MG PO TABS: 12.5000 mg | ORAL_TABLET | Freq: Every day | ORAL | 3 refills | Status: DC

## 2016-01-08 MED ORDER — AMITRIPTYLINE HCL 25 MG PO TABS: 25.0000 mg | ORAL_TABLET | Freq: Every day | ORAL | 2 refills | Status: DC
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
## 2016-01-08 NOTE — Progress Notes (Signed)
Pt is in today to establish care  Pt confirms pain  Pt states she is taking medications without any difficulties

## 2016-01-08 NOTE — Patient Instructions (Addendum)
Hypertension Hypertension is another name for high blood pressure. High blood pressure forces your heart to work harder to pump blood. A blood pressure reading has two numbers, which includes a higher number over a lower number (example: 110/72). HOME CARE   Have your blood pressure rechecked by your doctor.  Only take medicine as told by your doctor. Follow the directions carefully. The medicine does not work as well if you skip doses. Skipping doses also puts you at risk for problems.  Do not smoke.  Monitor your blood pressure at home as told by your doctor. GET HELP IF:  You think you are having a reaction to the medicine you are taking.  You have repeat headaches or feel dizzy.  You have puffiness (swelling) in your ankles.  You have trouble with your vision. GET HELP RIGHT AWAY IF:   You get a very bad headache and are confused.  You feel weak, numb, or faint.  You get chest or belly (abdominal) pain.  You throw up (vomit).  You cannot breathe very well. MAKE SURE YOU:   Understand these instructions.  Will watch your condition.  Will get help right away if you are not doing well or get worse.   This information is not intended to replace advice given to you by your health care provider. Make sure you discuss any questions you have with your health care provider.   Document Released: 09/24/2007 Document Revised: 04/12/2013 Document Reviewed: 01/28/2013 Elsevier Interactive Patient Education 2016 Elsevier Inc.  - DASH Eating Plan DASH stands for "Dietary Approaches to Stop Hypertension." The DASH eating plan is a healthy eating plan that has been shown to reduce high blood pressure (hypertension). Additional health benefits may include reducing the risk of type 2 diabetes mellitus, heart disease, and stroke. The DASH eating plan may also help with weight loss. WHAT DO I NEED TO KNOW ABOUT THE DASH EATING PLAN? For the DASH eating plan, you will follow these  general guidelines:  Choose foods with a percent daily value for sodium of less than 5% (as listed on the food label).  Use salt-free seasonings or herbs instead of table salt or sea salt.  Check with your health care provider or pharmacist before using salt substitutes.  Eat lower-sodium products, often labeled as "lower sodium" or "no salt added."  Eat fresh foods.  Eat more vegetables, fruits, and low-fat dairy products.  Choose whole grains. Look for the word "whole" as the first word in the ingredient list.  Choose fish and skinless chicken or turkey more often than red meat. Limit fish, poultry, and meat to 6 oz (170 g) each day.  Limit sweets, desserts, sugars, and sugary drinks.  Choose heart-healthy fats.  Limit cheese to 1 oz (28 g) per day.  Eat more home-cooked food and less restaurant, buffet, and fast food.  Limit fried foods.  Cook foods using methods other than frying.  Limit canned vegetables. If you do use them, rinse them well to decrease the sodium.  When eating at a restaurant, ask that your food be prepared with less salt, or no salt if possible. WHAT FOODS CAN I EAT? Seek help from a dietitian for individual calorie needs. Grains Whole grain or whole wheat bread. Brown rice. Whole grain or whole wheat pasta. Quinoa, bulgur, and whole grain cereals. Low-sodium cereals. Corn or whole wheat flour tortillas. Whole grain cornbread. Whole grain crackers. Low-sodium crackers. Vegetables Fresh or frozen vegetables (raw, steamed, roasted, or grilled). Low-sodium or   reduced-sodium tomato and vegetable juices. Low-sodium or reduced-sodium tomato sauce and paste. Low-sodium or reduced-sodium canned vegetables.  Fruits All fresh, canned (in natural juice), or frozen fruits. Meat and Other Protein Products Ground beef (85% or leaner), grass-fed beef, or beef trimmed of fat. Skinless chicken or [place]. Ground chicken or [place]. Pork trimmed of fat. All fish and  seafood. Eggs. Dried beans, peas, or lentils. Unsalted nuts and seeds. Unsalted canned beans. Dairy Low-fat dairy products, such as skim or 1% milk, 2% or reduced-fat cheeses, low-fat ricotta or cottage cheese, or plain low-fat yogurt. Low-sodium or reduced-sodium cheeses. Fats and Oils Tub margarines without trans fats. Light or reduced-fat mayonnaise and salad dressings (reduced sodium). Avocado. Safflower, olive, or canola oils. Natural peanut or almond butter. Other Unsalted popcorn and pretzels. The items listed above may not be a complete list of recommended foods or beverages. Contact your dietitian for more options. WHAT FOODS ARE NOT RECOMMENDED? Grains White bread. White pasta. White rice. Refined cornbread. Bagels and croissants. Crackers that contain trans fat. Vegetables Creamed or fried vegetables. Vegetables in a cheese sauce. Regular canned vegetables. Regular canned tomato sauce and paste. Regular tomato and vegetable juices. Fruits Dried fruits. Canned fruit in light or heavy syrup. Fruit juice. Meat and Other Protein Products Fatty cuts of meat. Ribs, chicken wings, bacon, sausage, bologna, salami, chitterlings, fatback, hot dogs, bratwurst, and packaged luncheon meats. Salted nuts and seeds. Canned beans with salt. Dairy Whole or 2% milk, cream, half-and-half, and cream cheese. Whole-fat or sweetened yogurt. Full-fat cheeses or blue cheese. Nondairy creamers and whipped toppings. Processed cheese, cheese spreads, or cheese curds. Condiments Onion and garlic salt, seasoned salt, table salt, and sea salt. Canned and packaged gravies. Worcestershire sauce. Tartar sauce. Barbecue sauce. Teriyaki sauce. Soy sauce, including reduced sodium. Steak sauce. Fish sauce. Oyster sauce. Cocktail sauce. Horseradish. Ketchup and mustard. Meat flavorings and tenderizers. Bouillon cubes. Hot sauce. Tabasco sauce. Marinades. Taco seasonings. Relishes. Fats and Oils Butter, stick margarine,  lard, shortening, ghee, and bacon fat. Coconut, palm kernel, or palm oils. Regular salad dressings. Other Pickles and olives. Salted popcorn and pretzels. The items listed above may not be a complete list of foods and beverages to avoid. Contact your dietitian for more information. WHERE CAN I FIND MORE INFORMATION? National Heart, Lung, and Blood Institute: [employer]   This information is not intended to replace advice given to you by your health care provider. Make sure you discuss any questions you have with your health care provider.   Document Released: 03/27/2011 Document Revised: 04/28/2014 Document Reviewed: 02/09/2013 Elsevier Interactive Patient Education 2016 [employer].  -  You Can Quit Smoking If you are ready to quit smoking or are thinking about it, congratulations! You have chosen to help yourself be healthier and live longer! There are lots of different ways to quit smoking. Nicotine gum, nicotine patches, a nicotine inhaler, or nicotine nasal spray can help with physical craving. Hypnosis, support groups, and medicines help break the habit of smoking. TIPS TO GET OFF AND STAY OFF CIGARETTES  Learn to predict your moods. Do not let a bad situation be your excuse to have a cigarette. Some situations in your life might tempt you to have a cigarette.  Ask friends and co-workers not to smoke around you.  Make your home smoke-free.  Never have "just one" cigarette. It leads to wanting another and another. Remind yourself of your decision to quit.  On a card, make a list of your reasons for not  smoking. Read it at least the same number of times a day as you have a cigarette. Tell yourself everyday, "I do not want to smoke. I choose not to smoke."  Ask someone at home or work to help you with your plan to quit smoking.  Have something planned after you eat or have a cup of coffee. Take a walk or get other exercise to perk you up.  This will help to keep you from overeating.  Try a relaxation exercise to calm you down and decrease your stress. Remember, you may be tense and nervous the first two weeks after you quit. This will pass.  Find new activities to keep your hands busy. Play with a pen, coin, or rubber band. Doodle or draw things on paper.  Brush your teeth right after eating. This will help cut down the craving for the taste of tobacco after meals. You can try mouthwash too.  Try gum, breath mints, or diet candy to keep something in your mouth. IF YOU SMOKE AND WANT TO QUIT:  Do not stock up on cigarettes. Never buy a carton. Wait until one pack is finished before you buy another.  Never carry cigarettes with you at work or at home.  Keep cigarettes as far away from you as possible. Leave them with someone else.  Never carry matches or a lighter with you.  Ask yourself, "Do I need this cigarette or is this just a reflex?"  Bet with someone that you can quit. Put cigarette money in a piggy bank every morning. If you smoke, you give up the money. If you do not smoke, by the end of the week, you keep the money.  Keep trying. It takes 21 days to change a habit!  Talk to your doctor about using medicines to help you quit. These include nicotine replacement gum, lozenges, or skin patches.   This information is not intended to replace advice given to you by your health care provider. Make sure you discuss any questions you have with your health care provider.   Document Released: 02/01/2009 Document Revised: 06/30/2011 Document Reviewed: 02/01/2009 Elsevier Interactive Patient Education [employer] Elsevier Inc.

## 2016-01-09 ENCOUNTER — Other Ambulatory Visit: Payer: Self-pay | Admitting: Internal Medicine

## 2016-01-09 LAB — HIV ANTIBODY (ROUTINE TESTING W REFLEX): HIV 1&2 Ab, 4th Generation: NONREACTIVE

## 2016-01-09 LAB — HEPATITIS C ANTIBODY: HCV AB: NEGATIVE

## 2016-01-09 LAB — VITAMIN D 25 HYDROXY (VIT D DEFICIENCY, FRACTURES): VIT D 25 HYDROXY: 14 ng/mL — AB (ref 30–100)

## 2016-01-09 MED ORDER — VITAMIN D (ERGOCALCIFEROL) 1.25 MG (50000 UNIT) PO CAPS: 50000.0000 [IU] | ORAL_CAPSULE | ORAL | 0 refills | Status: DC

## 2016-01-09 MED FILL — AMITRIPTYLINE HCL 25 MG TAB: 25 | 30 days supply | Qty: 30 | Fill #0

## 2016-01-09 MED FILL — VIT D2 1.25 MG (50,000 UNIT: 1.25 MG | 28 days supply | Qty: 4 | Fill #0

## 2016-01-11 ENCOUNTER — Telehealth: Payer: Self-pay

## 2016-01-11 NOTE — Telephone Encounter (Signed)
Contacted pt to go over lab results pt is aware of results and doesn't have any questions or concerns 

## 2016-01-16 ENCOUNTER — Telehealth: Payer: Self-pay

## 2016-01-16 NOTE — Telephone Encounter (Signed)
Pt returned call and went over lab results pt is aware and doesn't have any questions or concerns  

## 2016-01-16 NOTE — Telephone Encounter (Signed)
Contacted pt to go over lab results pt is unavailable this is the second attempt will be mailing results out today

## 2016-01-22 ENCOUNTER — Ambulatory Visit: Payer: Self-pay | Attending: Internal Medicine

## 2016-02-19 ENCOUNTER — Encounter: Payer: Self-pay | Admitting: Internal Medicine

## 2016-02-19 ENCOUNTER — Ambulatory Visit: Payer: Self-pay | Attending: Internal Medicine | Admitting: Internal Medicine

## 2016-02-19 VITALS — BP 154/96 | HR 76 | Temp 98.2°F | Resp 16 | Wt 141.2 lb

## 2016-02-19 DIAGNOSIS — E119 Type 2 diabetes mellitus without complications: Secondary | ICD-10-CM | POA: Insufficient documentation

## 2016-02-19 DIAGNOSIS — Z1231 Encounter for screening mammogram for malignant neoplasm of breast: Secondary | ICD-10-CM

## 2016-02-19 DIAGNOSIS — R51 Headache: Secondary | ICD-10-CM | POA: Insufficient documentation

## 2016-02-19 DIAGNOSIS — H538 Other visual disturbances: Secondary | ICD-10-CM

## 2016-02-19 DIAGNOSIS — R519 Headache, unspecified: Secondary | ICD-10-CM

## 2016-02-19 DIAGNOSIS — Z1239 Encounter for other screening for malignant neoplasm of breast: Secondary | ICD-10-CM

## 2016-02-19 DIAGNOSIS — Z01411 Encounter for gynecological examination (general) (routine) with abnormal findings: Secondary | ICD-10-CM | POA: Insufficient documentation

## 2016-02-19 DIAGNOSIS — Z124 Encounter for screening for malignant neoplasm of cervix: Secondary | ICD-10-CM

## 2016-02-19 DIAGNOSIS — I10 Essential (primary) hypertension: Secondary | ICD-10-CM | POA: Insufficient documentation

## 2016-02-19 DIAGNOSIS — Z1211 Encounter for screening for malignant neoplasm of colon: Secondary | ICD-10-CM

## 2016-02-19 DIAGNOSIS — Z7982 Long term (current) use of aspirin: Secondary | ICD-10-CM | POA: Insufficient documentation

## 2016-02-19 DIAGNOSIS — K029 Dental caries, unspecified: Secondary | ICD-10-CM | POA: Insufficient documentation

## 2016-02-19 DIAGNOSIS — Z79899 Other long term (current) drug therapy: Secondary | ICD-10-CM | POA: Insufficient documentation

## 2016-02-19 DIAGNOSIS — H5462 Unqualified visual loss, left eye, normal vision right eye: Secondary | ICD-10-CM | POA: Insufficient documentation

## 2016-02-19 DIAGNOSIS — G4700 Insomnia, unspecified: Secondary | ICD-10-CM | POA: Insufficient documentation

## 2016-02-19 MED ORDER — HYDROCHLOROTHIAZIDE 25 MG PO TABS: 25.0000 mg | ORAL_TABLET | Freq: Every day | ORAL | 3 refills | Status: DC

## 2016-02-19 MED ORDER — AMITRIPTYLINE HCL 50 MG PO TABS: 50.0000 mg | ORAL_TABLET | Freq: Every day | ORAL | 2 refills | Status: DC

## 2016-02-19 MED FILL — AMITRIPTYLINE HCL 50 MG TAB: 50 | 30 days supply | Qty: 30 | Fill #0

## 2016-02-19 MED FILL — HYDROCHLOROTHIAZIDE 25 MG T: 25 | 30 days supply | Qty: 30 | Fill #0
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
## 2016-02-19 NOTE — Progress Notes (Signed)
Pt is in the office today for a pap Pt states she is not in any pain Pt states she is taking medications without difficulty  

## 2016-02-19 NOTE — Patient Instructions (Signed)
Health Maintenance, Female Adopting a healthy lifestyle and getting preventive care can go a long way to promote health and wellness. Talk with your health care provider about what schedule of regular examinations is right for you. This is a good chance for you to check in with your provider about disease prevention and staying healthy. In between checkups, there are plenty of things you can do on your own. Experts have done a lot of research about which lifestyle changes and preventive measures are most likely to keep you healthy. Ask your health care provider for more information. WEIGHT AND DIET  Eat a healthy diet  Be sure to include plenty of vegetables, fruits, low-fat dairy products, and lean protein.  Do not eat a lot of foods high in solid fats, added sugars, or salt.  Get regular exercise. This is one of the most important things you can do for your health.  Most adults should exercise for at least 150 minutes each week. The exercise should increase your heart rate and make you sweat (moderate-intensity exercise).  Most adults should also do strengthening exercises at least twice a week. This is in addition to the moderate-intensity exercise.  Maintain a healthy weight  Body mass index (BMI) is a measurement that can be used to identify possible weight problems. It estimates body fat based on height and weight. Your health care provider can help determine your BMI and help you achieve or maintain a healthy weight.  For females 20 years of age and older:   A BMI below 18.5 is considered underweight.  A BMI of 18.5 to 24.9 is normal.  A BMI of 25 to 29.9 is considered overweight.  A BMI of 30 and above is considered obese.  Watch levels of cholesterol and blood lipids  You should start having your blood tested for lipids and cholesterol at 52 years of age, then have this test every 5 years.  You may need to have your cholesterol levels checked more often if:  Your lipid  or cholesterol levels are high.  You are older than 52 years of age.  You are at high risk for heart disease.  CANCER SCREENING   Lung Cancer  Lung cancer screening is recommended for adults 55-80 years old who are at high risk for lung cancer because of a history of smoking.  A yearly low-dose CT scan of the lungs is recommended for people who:  Currently smoke.  Have quit within the past 15 years.  Have at least a 30-pack-year history of smoking. A pack year is smoking an average of one pack of cigarettes a day for 1 year.  Yearly screening should continue until it has been 15 years since you quit.  Yearly screening should stop if you develop a health problem that would prevent you from having lung cancer treatment.  Breast Cancer  Practice breast self-awareness. This means understanding how your breasts normally appear and feel.  It also means doing regular breast self-exams. Let your health care provider know about any changes, no matter how small.  If you are in your 20s or 30s, you should have a clinical breast exam (CBE) by a health care provider every 1-3 years as part of a regular health exam.  If you are 40 or older, have a CBE every year. Also consider having a breast X-ray (mammogram) every year.  If you have a family history of breast cancer, talk to your health care provider about genetic screening.  If you   are at high risk for breast cancer, talk to your health care provider about having an MRI and a mammogram every year.  Breast cancer gene (BRCA) assessment is recommended for women who have family members with BRCA-related cancers. BRCA-related cancers include:  Breast.  Ovarian.  Tubal.  Peritoneal cancers.  Results of the assessment will determine the need for genetic counseling and BRCA1 and BRCA2 testing. Cervical Cancer Your health care provider may recommend that you be screened regularly for cancer of the pelvic organs (ovaries, uterus, and  vagina). This screening involves a pelvic examination, including checking for microscopic changes to the surface of your cervix (Pap test). You may be encouraged to have this screening done every 3 years, beginning at age 21.  For women ages 30-65, health care providers may recommend pelvic exams and Pap testing every 3 years, or they may recommend the Pap and pelvic exam, combined with testing for human papilloma virus (HPV), every 5 years. Some types of HPV increase your risk of cervical cancer. Testing for HPV may also be done on women of any age with unclear Pap test results.  Other health care providers may not recommend any screening for nonpregnant women who are considered low risk for pelvic cancer and who do not have symptoms. Ask your health care provider if a screening pelvic exam is right for you.  If you have had past treatment for cervical cancer or a condition that could lead to cancer, you need Pap tests and screening for cancer for at least 20 years after your treatment. If Pap tests have been discontinued, your risk factors (such as having a new sexual partner) need to be reassessed to determine if screening should resume. Some women have medical problems that increase the chance of getting cervical cancer. In these cases, your health care provider may recommend more frequent screening and Pap tests. Colorectal Cancer  This type of cancer can be detected and often prevented.  Routine colorectal cancer screening usually begins at 52 years of age and continues through 52 years of age.  Your health care provider may recommend screening at an earlier age if you have risk factors for colon cancer.  Your health care provider may also recommend using home test kits to check for hidden blood in the stool.  A small camera at the end of a tube can be used to examine your colon directly (sigmoidoscopy or colonoscopy). This is done to check for the earliest forms of colorectal  cancer.  Routine screening usually begins at age 50.  Direct examination of the colon should be repeated every 5-10 years through 52 years of age. However, you may need to be screened more often if early forms of precancerous polyps or small growths are found. Skin Cancer  Check your skin from head to toe regularly.  Tell your health care provider about any new moles or changes in moles, especially if there is a change in a mole's shape or color.  Also tell your health care provider if you have a mole that is larger than the size of a pencil eraser.  Always use sunscreen. Apply sunscreen liberally and repeatedly throughout the day.  Protect yourself by wearing long sleeves, pants, a wide-brimmed hat, and sunglasses whenever you are outside. HEART DISEASE, DIABETES, AND HIGH BLOOD PRESSURE   High blood pressure causes heart disease and increases the risk of stroke. High blood pressure is more likely to develop in:  People who have blood pressure in the high end   of the normal range (130-139/85-89 mm Hg).  People who are overweight or obese.  People who are African American.  If you are 38-23 years of age, have your blood pressure checked every 3-5 years. If you are 61 years of age or older, have your blood pressure checked every year. You should have your blood pressure measured twice--once when you are at a hospital or clinic, and once when you are not at a hospital or clinic. Record the average of the two measurements. To check your blood pressure when you are not at a hospital or clinic, you can use:  An automated blood pressure machine at a pharmacy.  A home blood pressure monitor.  If you are between 45 years and 39 years old, ask your health care provider if you should take aspirin to prevent strokes.  Have regular diabetes screenings. This involves taking a blood sample to check your fasting blood sugar level.  If you are at a normal weight and have a low risk for diabetes,  have this test once every three years after 52 years of age.  If you are overweight and have a high risk for diabetes, consider being tested at a younger age or more often. PREVENTING INFECTION  Hepatitis B  If you have a higher risk for hepatitis B, you should be screened for this virus. You are considered at high risk for hepatitis B if:  You were born in a country where hepatitis B is common. Ask your health care provider which countries are considered high risk.  Your parents were born in a high-risk country, and you have not been immunized against hepatitis B (hepatitis B vaccine).  You have HIV or AIDS.  You use needles to inject street drugs.  You live with someone who has hepatitis B.  You have had sex with someone who has hepatitis B.  You get hemodialysis treatment.  You take certain medicines for conditions, including cancer, organ transplantation, and autoimmune conditions. Hepatitis C  Blood testing is recommended for:  Everyone born from [birth year] through 1967.  Anyone with known risk factors for hepatitis C. Sexually transmitted infections (STIs)  You should be screened for sexually transmitted infections (STIs) including gonorrhea and chlamydia if:  You are sexually active and are younger than 52 years of age.  You are older than 53 years of age and your health care provider tells you that you are at risk for this type of infection.  Your sexual activity has changed since you were last screened and you are at an increased risk for chlamydia or gonorrhea. Ask your health care provider if you are at risk.  If you do not have HIV, but are at risk, it may be recommended that you take a prescription medicine daily to prevent HIV infection. This is called pre-exposure prophylaxis (PrEP). You are considered at risk if:  You are sexually active and do not regularly use condoms or know the HIV status of your partner(s).  You take drugs by injection.  You are sexually  active with a partner who has HIV. Talk with your health care provider about whether you are at high risk of being infected with HIV. If you choose to begin PrEP, you should first be tested for HIV. You should then be tested every 3 months for as long as you are taking PrEP.  PREGNANCY   If you are premenopausal and you may become pregnant, ask your health care provider about preconception counseling.  If you may  become pregnant, take 400 to 800 micrograms (mcg) of folic acid every day.  If you want to prevent pregnancy, talk to your health care provider about birth control (contraception). OSTEOPOROSIS AND MENOPAUSE   Osteoporosis is a disease in which the bones lose minerals and strength with aging. This can result in serious bone fractures. Your risk for osteoporosis can be identified using a bone density scan.  If you are 65 years of age or older, or if you are at risk for osteoporosis and fractures, ask your health care provider if you should be screened.  Ask your health care provider whether you should take a calcium or vitamin D supplement to lower your risk for osteoporosis.  Menopause may have certain physical symptoms and risks.  Hormone replacement therapy may reduce some of these symptoms and risks. Talk to your health care provider about whether hormone replacement therapy is right for you.  HOME CARE INSTRUCTIONS   Schedule regular health, dental, and eye exams.  Stay current with your immunizations.   Do not use any tobacco products including cigarettes, chewing tobacco, or electronic cigarettes.  If you are pregnant, do not drink alcohol.  If you are breastfeeding, limit how much and how often you drink alcohol.  Limit alcohol intake to no more than 1 drink per day for nonpregnant women. One drink equals 12 ounces of beer, 5 ounces of wine, or 1 ounces of hard liquor.  Do not use street drugs.  Do not share needles.  Ask your health care provider for help if  you need support or information about quitting drugs.  Tell your health care provider if you often feel depressed.  Tell your health care provider if you have ever been abused or do not feel safe at home.   This information is not intended to replace advice given to you by your health care provider. Make sure you discuss any questions you have with your health care provider.   Document Released: 10/21/2010 Document Revised: 04/28/2014 Document Reviewed: 03/09/2013 Elsevier Interactive Patient Education 2016 [employer]  Smoking Cessation, Tips for Success If you are ready to quit smoking, congratulations! You have chosen to help yourself be healthier. Cigarettes bring nicotine, tar, carbon monoxide, and other irritants into your body. Your lungs, heart, and blood vessels will be able to work better without these poisons. There are many different ways to quit smoking. Nicotine gum, nicotine patches, a nicotine inhaler, or nicotine nasal spray can help with physical craving. Hypnosis, support groups, and medicines help break the habit of smoking. WHAT THINGS CAN I DO TO MAKE QUITTING EASIER?  Here are some tips to help you quit for good:  Pick a date when you will quit smoking completely. Tell all of your friends and family about your plan to quit on that date.  Do not try to slowly cut down on the number of cigarettes you are smoking. Pick a quit date and quit smoking completely starting on that day.  Throw away all cigarettes.   Clean and remove all ashtrays from your home, work, and car.  On a card, write down your reasons for quitting. Carry the card with you and read it when you get the urge to smoke.  Cleanse your body of nicotine. Drink enough water and fluids to keep your urine clear or pale yellow. Do this after quitting to flush the nicotine from your body.  Learn to predict your moods. Do not let a bad situation be your excuse to have   a cigarette. Some situations in your life  might tempt you into wanting a cigarette.  Never have "just one" cigarette. It leads to wanting another and another. Remind yourself of your decision to quit.  Change habits associated with smoking. If you smoked while driving or when feeling stressed, try other activities to replace smoking. Stand up when drinking your coffee. Brush your teeth after eating. Sit in a different chair when you read the paper. Avoid alcohol while trying to quit, and try to drink fewer caffeinated beverages. Alcohol and caffeine may urge you to smoke.  Avoid foods and drinks that can trigger a desire to smoke, such as sugary or spicy foods and alcohol.  Ask people who smoke not to smoke around you.  Have something planned to do right after eating or having a cup of coffee. For example, plan to take a walk or exercise.  Try a relaxation exercise to calm you down and decrease your stress. Remember, you may be tense and nervous for the first 2 weeks after you quit, but this will pass.  Find new activities to keep your hands busy. Play with a pen, coin, or rubber band. Doodle or draw things on paper.  Brush your teeth right after eating. This will help cut down on the craving for the taste of tobacco after meals. You can also try mouthwash.   Use oral substitutes in place of cigarettes. Try using lemon drops, carrots, cinnamon sticks, or chewing gum. Keep them handy so they are available when you have the urge to smoke.  When you have the urge to smoke, try deep breathing.  Designate your home as a nonsmoking area.  If you are a heavy smoker, ask your health care provider about a prescription for nicotine chewing gum. It can ease your withdrawal from nicotine.  Reward yourself. Set aside the cigarette money you save and buy yourself something nice.  Look for support from others. Join a support group or smoking cessation program. Ask someone at home or at work to help you with your plan to quit smoking.  Always  ask yourself, "Do I need this cigarette or is this just a reflex?" Tell yourself, "Today, I choose not to smoke," or "I do not want to smoke." You are reminding yourself of your decision to quit.  Do not replace cigarette smoking with electronic cigarettes (commonly called e-cigarettes). The safety of e-cigarettes is unknown, and some may contain harmful chemicals.  If you relapse, do not give up! Plan ahead and think about what you will do the next time you get the urge to smoke. HOW WILL I FEEL WHEN I QUIT SMOKING? You may have symptoms of withdrawal because your body is used to nicotine (the addictive substance in cigarettes). You may crave cigarettes, be irritable, feel very hungry, cough often, get headaches, or have difficulty concentrating. The withdrawal symptoms are only temporary. They are strongest when you first quit but will go away within 10-14 days. When withdrawal symptoms occur, stay in control. Think about your reasons for quitting. Remind yourself that these are signs that your body is healing and getting used to being without cigarettes. Remember that withdrawal symptoms are easier to treat than the major diseases that smoking can cause.  Even after the withdrawal is over, expect periodic urges to smoke. However, these cravings are generally short lived and will go away whether you smoke or not. Do not smoke! WHAT RESOURCES ARE AVAILABLE TO HELP ME QUIT SMOKING? Your health care   provider can direct you to community resources or hospitals for support, which may include:  Group support.  Education.  Hypnosis.  Therapy.   This information is not intended to replace advice given to you by your health care provider. Make sure you discuss any questions you have with your health care provider.   Document Released: 01/04/2004 Document Revised: 04/28/2014 Document Reviewed: 09/23/2012 Elsevier Interactive Patient Education 2016 [employer]  

## 2016-02-20 LAB — CERVICOVAGINAL ANCILLARY ONLY
CHLAMYDIA, DNA PROBE: NEGATIVE
Neisseria Gonorrhea: NEGATIVE
Wet Prep (BD Affirm): POSITIVE — AB

## 2016-02-22 LAB — CYTOLOGY - PAP
DIAGNOSIS: NEGATIVE
HPV: NOT DETECTED

## 2016-02-23 ENCOUNTER — Other Ambulatory Visit: Payer: Self-pay | Admitting: Internal Medicine

## 2016-02-23 MED ORDER — NYSTATIN 100000 UNIT/GM EX POWD: Freq: Four times a day (QID) | CUTANEOUS | 0 refills | Status: DC

## 2016-02-23 MED ORDER — FLUCONAZOLE 150 MG PO TABS: 150.0000 mg | ORAL_TABLET | Freq: Every day | ORAL | 0 refills | Status: DC

## 2016-02-25 LAB — CERVICOVAGINAL ANCILLARY ONLY: Herpes: NEGATIVE

## 2016-02-25 MED FILL — NYSTOP 100,000 UNITS/GM PWD: 100000 | 7 days supply | Qty: 15 | Fill #0

## 2016-02-25 MED FILL — FLUCONAZOLE 150 MG TABLET: 150 | 1 days supply | Qty: 1 | Fill #0

## 2016-02-27 ENCOUNTER — Telehealth: Payer: Self-pay

## 2016-02-27 NOTE — Telephone Encounter (Signed)
Contacted pt to go over lab results pt is aware of results and doesn't have any questions or concerns 

## 2016-03-20 ENCOUNTER — Encounter: Payer: Self-pay | Admitting: Internal Medicine

## 2016-07-23 ENCOUNTER — Telehealth: Payer: Self-pay

## 2016-07-24 NOTE — Telephone Encounter (Signed)
called to find out if colonoscopy could be schedule for pt

## 2016-09-04 ENCOUNTER — Encounter: Payer: Self-pay | Admitting: Internal Medicine

## 2016-09-05 ENCOUNTER — Encounter: Payer: Self-pay | Admitting: Internal Medicine

## 2016-09-08 ENCOUNTER — Encounter: Payer: Self-pay | Admitting: Internal Medicine

## 2016-12-14 ENCOUNTER — Emergency Department (HOSPITAL_COMMUNITY)
Admission: EM | Admit: 2016-12-14 | Discharge: 2016-12-14 | Disposition: A | Discharge status: Home / Self Care | Payer: Self-pay | Attending: Emergency Medicine | Admitting: Emergency Medicine

## 2016-12-14 ENCOUNTER — Emergency Department (HOSPITAL_COMMUNITY): Payer: Self-pay

## 2016-12-14 ENCOUNTER — Encounter (HOSPITAL_COMMUNITY): Payer: Self-pay | Admitting: Emergency Medicine

## 2016-12-14 DIAGNOSIS — Y939 Activity, unspecified: Secondary | ICD-10-CM | POA: Insufficient documentation

## 2016-12-14 DIAGNOSIS — S42212A Unspecified displaced fracture of surgical neck of left humerus, initial encounter for closed fracture: Secondary | ICD-10-CM | POA: Insufficient documentation

## 2016-12-14 DIAGNOSIS — W109XXA Fall (on) (from) unspecified stairs and steps, initial encounter: Secondary | ICD-10-CM | POA: Insufficient documentation

## 2016-12-14 DIAGNOSIS — E119 Type 2 diabetes mellitus without complications: Secondary | ICD-10-CM | POA: Insufficient documentation

## 2016-12-14 DIAGNOSIS — Y92008 Other place in unspecified non-institutional (private) residence as the place of occurrence of the external cause: Secondary | ICD-10-CM | POA: Insufficient documentation

## 2016-12-14 DIAGNOSIS — Y999 Unspecified external cause status: Secondary | ICD-10-CM | POA: Insufficient documentation

## 2016-12-14 MED ORDER — FENTANYL CITRATE (PF) 100 MCG/2ML IJ SOLN
50.0000 ug | Freq: Once | INTRAMUSCULAR | Status: AC
  Administered 2016-12-14: 50 ug via INTRAVENOUS
  Filled 2016-12-14: qty 2

## 2016-12-14 MED ORDER — OXYCODONE-ACETAMINOPHEN 5-325 MG PO TABS
2.0000 | ORAL_TABLET | Freq: Once | ORAL | Status: AC
  Administered 2016-12-14: 2 via ORAL
  Filled 2016-12-14: qty 2

## 2016-12-14 MED ORDER — MORPHINE SULFATE (PF) 4 MG/ML IV SOLN
4.0000 mg | Freq: Once | INTRAVENOUS | Status: AC
  Administered 2016-12-14: 4 mg via INTRAVENOUS
  Filled 2016-12-14: qty 1

## 2016-12-14 MED ORDER — DOCUSATE SODIUM 250 MG PO CAPS: 250.0000 mg | ORAL_CAPSULE | Freq: Every day | ORAL | 0 refills | Status: DC

## 2016-12-14 MED ORDER — FENTANYL CITRATE (PF) 100 MCG/2ML IJ SOLN
INTRAMUSCULAR | Status: AC
  Filled 2016-12-14: qty 2

## 2016-12-14 MED ORDER — ONDANSETRON HCL 4 MG/2ML IJ SOLN
4.0000 mg | Freq: Once | INTRAMUSCULAR | Status: AC
  Administered 2016-12-14: 4 mg via INTRAVENOUS
  Filled 2016-12-14: qty 2

## 2016-12-14 MED ORDER — KETOROLAC TROMETHAMINE 60 MG/2ML IM SOLN
60.0000 mg | Freq: Once | INTRAMUSCULAR | Status: AC
  Administered 2016-12-14: 60 mg via INTRAMUSCULAR
  Filled 2016-12-14: qty 2

## 2016-12-14 MED ORDER — OXYCODONE-ACETAMINOPHEN 5-325 MG PO TABS: 1.0000 | ORAL_TABLET | ORAL | 0 refills | Status: DC | PRN
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
## 2016-12-14 NOTE — ED Notes (Signed)
Pt brought back from ct, stated that her pain was too severe for the scan, edp notified.

## 2016-12-14 NOTE — ED Notes (Signed)
This rn went to administer 50 mcg fentanyl IV to pt and the syringe was loose wasting the fentanyl onto the floor, wasted with Sharrie Rothman RN and another dose was pulled and given to pt, remainder wasted with Sharrie Rothman, RN.

## 2016-12-14 NOTE — Discharge Instructions (Signed)
WEAR YOUR SLING AT ALL TIMES.  TAKE STOOL SOFTENER (COLACE) WHILE TAKING PAIN MEDICATIONS  FOLLOW-UP WITH DR. Eulah Pont TOMORROW, 8/27

## 2016-12-14 NOTE — ED Notes (Signed)
Ct called and told that the patient was ready for her scan

## 2016-12-14 NOTE — ED Notes (Signed)
Patient transported to CT 

## 2016-12-14 NOTE — ED Triage Notes (Signed)
Pt states she had a mechanical fall walking up a stair, due to blindness in left eye, yesterday. Pain to left shoulder, arm hanging in low position with possible shoulder dislocation. Pulses 3+ radially.

## 2016-12-15 ENCOUNTER — Encounter (HOSPITAL_BASED_OUTPATIENT_CLINIC_OR_DEPARTMENT_OTHER): Payer: Self-pay | Admitting: *Deleted

## 2016-12-16 ENCOUNTER — Ambulatory Visit (HOSPITAL_BASED_OUTPATIENT_CLINIC_OR_DEPARTMENT_OTHER): Payer: Self-pay | Admitting: Anesthesiology

## 2016-12-16 ENCOUNTER — Encounter (HOSPITAL_BASED_OUTPATIENT_CLINIC_OR_DEPARTMENT_OTHER): Payer: Self-pay

## 2016-12-16 ENCOUNTER — Ambulatory Visit (HOSPITAL_COMMUNITY): Payer: Self-pay

## 2016-12-16 ENCOUNTER — Encounter (HOSPITAL_BASED_OUTPATIENT_CLINIC_OR_DEPARTMENT_OTHER)
Admission: RE | Disposition: A | Discharge status: Home / Self Care | Payer: Self-pay | Source: Ambulatory Visit | Attending: Orthopedic Surgery

## 2016-12-16 ENCOUNTER — Ambulatory Visit (HOSPITAL_BASED_OUTPATIENT_CLINIC_OR_DEPARTMENT_OTHER)
Admission: RE | Admit: 2016-12-16 | Discharge: 2016-12-16 | Disposition: A | Discharge status: Home / Self Care | Payer: Self-pay | Source: Ambulatory Visit | Attending: Orthopedic Surgery | Admitting: Orthopedic Surgery

## 2016-12-16 DIAGNOSIS — S42309A Unspecified fracture of shaft of humerus, unspecified arm, initial encounter for closed fracture: Secondary | ICD-10-CM

## 2016-12-16 DIAGNOSIS — H5462 Unqualified visual loss, left eye, normal vision right eye: Secondary | ICD-10-CM | POA: Insufficient documentation

## 2016-12-16 DIAGNOSIS — R7303 Prediabetes: Secondary | ICD-10-CM | POA: Insufficient documentation

## 2016-12-16 DIAGNOSIS — F17200 Nicotine dependence, unspecified, uncomplicated: Secondary | ICD-10-CM | POA: Insufficient documentation

## 2016-12-16 DIAGNOSIS — I10 Essential (primary) hypertension: Secondary | ICD-10-CM | POA: Insufficient documentation

## 2016-12-16 DIAGNOSIS — S42292A Other displaced fracture of upper end of left humerus, initial encounter for closed fracture: Secondary | ICD-10-CM | POA: Insufficient documentation

## 2016-12-16 DIAGNOSIS — W010XXA Fall on same level from slipping, tripping and stumbling without subsequent striking against object, initial encounter: Secondary | ICD-10-CM | POA: Insufficient documentation

## 2016-12-16 HISTORY — DX: Unspecified fracture of shaft of humerus, unspecified arm, initial encounter for closed fracture: S42.309A

## 2016-12-16 HISTORY — DX: Essential (primary) hypertension: I10

## 2016-12-16 HISTORY — DX: Nicotine dependence, unspecified, uncomplicated: F17.200

## 2016-12-16 HISTORY — PX: ORIF HUMERUS FRACTURE: SHX2126

## 2016-12-16 HISTORY — DX: Prediabetes: R73.03

## 2016-12-16 LAB — POCT I-STAT, CHEM 8
CHLORIDE: 98 mmol/L — AB (ref 101–111)
CREATININE: 0.4 mg/dL — AB (ref 0.44–1.00)
Calcium, Ion: 1.08 mmol/L — ABNORMAL LOW (ref 1.15–1.40)
Glucose, Bld: 124 mg/dL — ABNORMAL HIGH (ref 65–99)
HEMATOCRIT: 35 % — AB (ref 36.0–46.0)
Hemoglobin: 11.9 g/dL — ABNORMAL LOW (ref 12.0–15.0)
POTASSIUM: 2.9 mmol/L — AB (ref 3.5–5.1)
Sodium: 137 mmol/L (ref 135–145)
TCO2: 28 mmol/L (ref 22–32)

## 2016-12-16 SURGERY — OPEN REDUCTION INTERNAL FIXATION (ORIF) PROXIMAL HUMERUS FRACTURE
Anesthesia: General | Site: Arm Upper | Laterality: Left

## 2016-12-16 MED ORDER — PROPOFOL 500 MG/50ML IV EMUL
INTRAVENOUS | Status: AC
  Filled 2016-12-16: qty 50

## 2016-12-16 MED ORDER — CEFAZOLIN SODIUM-DEXTROSE 2-4 GM/100ML-% IV SOLN
2.0000 g | INTRAVENOUS | Status: AC
Start: 2016-12-16 — End: 2016-12-16
  Administered 2016-12-16: 2 g via INTRAVENOUS

## 2016-12-16 MED ORDER — BUPIVACAINE-EPINEPHRINE (PF) 0.5% -1:200000 IJ SOLN
INTRAMUSCULAR | Status: AC
  Filled 2016-12-16: qty 30

## 2016-12-16 MED ORDER — OXYCODONE-ACETAMINOPHEN 5-325 MG PO TABS: 1.0000 | ORAL_TABLET | ORAL | 0 refills | Status: DC | PRN

## 2016-12-16 MED ORDER — ONDANSETRON HCL 4 MG PO TABS: 4.0000 mg | ORAL_TABLET | Freq: Three times a day (TID) | ORAL | 0 refills | Status: DC | PRN

## 2016-12-16 MED ORDER — MIDAZOLAM HCL 2 MG/2ML IJ SOLN
1.0000 mg | INTRAMUSCULAR | Status: DC | PRN
  Administered 2016-12-16: 1 mg via INTRAVENOUS

## 2016-12-16 MED ORDER — BUPIVACAINE-EPINEPHRINE (PF) 0.5% -1:200000 IJ SOLN
INTRAMUSCULAR | Status: DC | PRN
  Administered 2016-12-16: 20 mL

## 2016-12-16 MED ORDER — LACTATED RINGERS IV SOLN
INTRAVENOUS | Status: DC
  Administered 2016-12-16: 10:00:00 via INTRAVENOUS

## 2016-12-16 MED ORDER — TRANEXAMIC ACID 1000 MG/10ML IV SOLN
INTRAVENOUS | Status: DC | PRN
  Administered 2016-12-16: 1000 mg via INTRAVENOUS

## 2016-12-16 MED ORDER — LIDOCAINE 2% (20 MG/ML) 5 ML SYRINGE
INTRAMUSCULAR | Status: AC
  Filled 2016-12-16: qty 5

## 2016-12-16 MED ORDER — ACETAMINOPHEN 500 MG PO TABS
1000.0000 mg | ORAL_TABLET | Freq: Once | ORAL | Status: AC
  Administered 2016-12-16: 1000 mg via ORAL

## 2016-12-16 MED ORDER — DEXAMETHASONE SODIUM PHOSPHATE 4 MG/ML IJ SOLN
INTRAMUSCULAR | Status: DC | PRN
  Administered 2016-12-16: 10 mg via INTRAVENOUS

## 2016-12-16 MED ORDER — TRANEXAMIC ACID 1000 MG/10ML IV SOLN: 1000.0000 mg | INTRAVENOUS | Status: DC

## 2016-12-16 MED ORDER — ACETAMINOPHEN 500 MG PO TABS
ORAL_TABLET | ORAL | Status: AC
  Filled 2016-12-16: qty 2

## 2016-12-16 MED ORDER — GABAPENTIN 300 MG PO CAPS
300.0000 mg | ORAL_CAPSULE | Freq: Once | ORAL | Status: AC
  Administered 2016-12-16: 300 mg via ORAL

## 2016-12-16 MED ORDER — DEXAMETHASONE SODIUM PHOSPHATE 10 MG/ML IJ SOLN
INTRAMUSCULAR | Status: AC
  Filled 2016-12-16: qty 1

## 2016-12-16 MED ORDER — SUCCINYLCHOLINE CHLORIDE 200 MG/10ML IV SOSY
PREFILLED_SYRINGE | INTRAVENOUS | Status: AC
  Filled 2016-12-16: qty 10

## 2016-12-16 MED ORDER — CHLORHEXIDINE GLUCONATE 4 % EX LIQD: 60.0000 mL | Freq: Once | CUTANEOUS | Status: DC

## 2016-12-16 MED ORDER — PROMETHAZINE HCL 25 MG/ML IJ SOLN: 6.2500 mg | INTRAMUSCULAR | Status: DC | PRN

## 2016-12-16 MED ORDER — ONDANSETRON HCL 4 MG/2ML IJ SOLN
INTRAMUSCULAR | Status: DC | PRN
  Administered 2016-12-16: 4 mg via INTRAVENOUS

## 2016-12-16 MED ORDER — ONDANSETRON HCL 4 MG/2ML IJ SOLN
INTRAMUSCULAR | Status: AC
  Filled 2016-12-16: qty 2

## 2016-12-16 MED ORDER — TRANEXAMIC ACID 1000 MG/10ML IV SOLN
1000.0000 mg | INTRAVENOUS | Status: AC
  Administered 2016-12-16: 1000 mg via INTRAVENOUS
  Filled 2016-12-16: qty 10

## 2016-12-16 MED ORDER — 0.9 % SODIUM CHLORIDE (POUR BTL) OPTIME
TOPICAL | Status: DC | PRN
  Administered 2016-12-16: 200 mL

## 2016-12-16 MED ORDER — PROPOFOL 10 MG/ML IV BOLUS
INTRAVENOUS | Status: DC | PRN
  Administered 2016-12-16: 180 mg via INTRAVENOUS
  Administered 2016-12-16: 20 mg via INTRAVENOUS

## 2016-12-16 MED ORDER — FENTANYL CITRATE (PF) 100 MCG/2ML IJ SOLN
INTRAMUSCULAR | Status: AC
  Filled 2016-12-16: qty 2

## 2016-12-16 MED ORDER — FENTANYL CITRATE (PF) 100 MCG/2ML IJ SOLN
25.0000 ug | INTRAMUSCULAR | Status: DC | PRN
  Administered 2016-12-16: 50 ug via INTRAVENOUS

## 2016-12-16 MED ORDER — CEFAZOLIN SODIUM-DEXTROSE 2-4 GM/100ML-% IV SOLN
INTRAVENOUS | Status: AC
  Filled 2016-12-16: qty 100

## 2016-12-16 MED ORDER — BUPIVACAINE HCL (PF) 0.5 % IJ SOLN
INTRAMUSCULAR | Status: AC
  Filled 2016-12-16: qty 30

## 2016-12-16 MED ORDER — FENTANYL CITRATE (PF) 100 MCG/2ML IJ SOLN
50.0000 ug | INTRAMUSCULAR | Status: AC | PRN
  Administered 2016-12-16 (×3): 50 ug via INTRAVENOUS

## 2016-12-16 MED ORDER — SCOPOLAMINE 1 MG/3DAYS TD PT72: 1.0000 | MEDICATED_PATCH | Freq: Once | TRANSDERMAL | Status: DC | PRN

## 2016-12-16 MED ORDER — SUCCINYLCHOLINE CHLORIDE 200 MG/10ML IV SOSY
PREFILLED_SYRINGE | INTRAVENOUS | Status: DC | PRN
  Administered 2016-12-16: 120 mg via INTRAVENOUS

## 2016-12-16 MED ORDER — LACTATED RINGERS IV SOLN
INTRAVENOUS | Status: DC
  Administered 2016-12-16: 09:00:00 via INTRAVENOUS

## 2016-12-16 MED ORDER — MIDAZOLAM HCL 2 MG/2ML IJ SOLN
INTRAMUSCULAR | Status: AC
  Filled 2016-12-16: qty 2

## 2016-12-16 MED ORDER — GABAPENTIN 300 MG PO CAPS
ORAL_CAPSULE | ORAL | Status: AC
  Filled 2016-12-16: qty 1

## 2016-12-16 SURGICAL SUPPLY — 79 items
AID PSTN UNV HD RSTRNT DISP (MISCELLANEOUS) ×1
BIT DRILL 3.2 (BIT) ×3
BIT DRILL 3.2XCALB NS DISP (BIT) IMPLANT
BIT DRILL CALIBRATED 2.7 (BIT) ×2 IMPLANT
BIT DRILL CALIBRATED 2.7MM (BIT) ×1
BIT DRL 3.2XCALB NS DISP (BIT) ×1
BLADE CLIPPER SURG (BLADE) IMPLANT
BLADE SURG 15 STRL LF DISP TIS (BLADE) ×1 IMPLANT
BLADE SURG 15 STRL SS (BLADE) ×3
CHLORAPREP W/TINT 26ML (MISCELLANEOUS) ×3 IMPLANT
CLOSURE STERI-STRIP 1/2X4 (GAUZE/BANDAGES/DRESSINGS) ×1
CLSR STERI-STRIP ANTIMIC 1/2X4 (GAUZE/BANDAGES/DRESSINGS) ×2 IMPLANT
DECANTER SPIKE VIAL GLASS SM (MISCELLANEOUS) IMPLANT
DRAPE C-ARM 42X72 X-RAY (DRAPES) ×3 IMPLANT
DRAPE IMP U-DRAPE 54X76 (DRAPES) ×3 IMPLANT
DRAPE INCISE IOBAN 66X45 STRL (DRAPES) ×3 IMPLANT
DRAPE OEC MINIVIEW 54X84 (DRAPES) IMPLANT
DRAPE U-SHAPE 47X51 STRL (DRAPES) ×3 IMPLANT
DRAPE U-SHAPE 76X120 STRL (DRAPES) ×6 IMPLANT
DRSG MEPILEX BORDER 4X8 (GAUZE/BANDAGES/DRESSINGS) ×3 IMPLANT
ELECT REM PT RETURN 9FT ADLT (ELECTROSURGICAL) ×3
ELECTRODE REM PT RTRN 9FT ADLT (ELECTROSURGICAL) ×1 IMPLANT
GAUZE SPONGE 4X4 12PLY STRL (GAUZE/BANDAGES/DRESSINGS) ×3 IMPLANT
GAUZE XEROFORM 1X8 LF (GAUZE/BANDAGES/DRESSINGS) ×3 IMPLANT
GLOVE BIO SURGEON STRL SZ 6.5 (GLOVE) ×2 IMPLANT
GLOVE BIO SURGEON STRL SZ7.5 (GLOVE) ×9 IMPLANT
GLOVE BIO SURGEONS STRL SZ 6.5 (GLOVE) ×1
GLOVE BIOGEL PI IND STRL 7.0 (GLOVE) ×1 IMPLANT
GLOVE BIOGEL PI IND STRL 8 (GLOVE) ×2 IMPLANT
GLOVE BIOGEL PI INDICATOR 7.0 (GLOVE) ×2
GLOVE BIOGEL PI INDICATOR 8 (GLOVE) ×4
GOWN STRL REUS W/ TWL LRG LVL3 (GOWN DISPOSABLE) ×2 IMPLANT
GOWN STRL REUS W/ TWL XL LVL3 (GOWN DISPOSABLE) ×1 IMPLANT
GOWN STRL REUS W/TWL LRG LVL3 (GOWN DISPOSABLE) ×6
GOWN STRL REUS W/TWL XL LVL3 (GOWN DISPOSABLE) ×3
K-WIRE 1.6 (WIRE) ×12
K-WIRE FX5X1.6XNS BN SS (WIRE) ×4
KWIRE FX5X1.6XNS BN SS (WIRE) IMPLANT
NS IRRIG 1000ML POUR BTL (IV SOLUTION) ×3 IMPLANT
PACK ARTHROSCOPY DSU (CUSTOM PROCEDURE TRAY) ×3 IMPLANT
PACK BASIN DAY SURGERY FS (CUSTOM PROCEDURE TRAY) ×3 IMPLANT
PEG LOCKING 3.2MMX26MM (Peg) ×3 IMPLANT
PEG LOCKING 3.2X34 (Screw) ×6 IMPLANT
PEG LOCKING 3.2X36 (Screw) ×3 IMPLANT
PEG LOCKING 3.2X42 (Screw) ×3 IMPLANT
PENCIL BUTTON HOLSTER BLD 10FT (ELECTRODE) ×3 IMPLANT
PLATE HUMERUS LP PROX L 3H (Plate) ×3 IMPLANT
RESTRAINT HEAD UNIVERSAL NS (MISCELLANEOUS) ×3 IMPLANT
SCREW LP NL T15 3.5X22 (Screw) ×6 IMPLANT
SCREW LP NL T15 3.5X26 (Screw) ×3 IMPLANT
SCREW PEG LOCK 3.2X30MM (Screw) ×3 IMPLANT
SLEEVE SCD COMPRESS KNEE MED (MISCELLANEOUS) ×2 IMPLANT
SLING ARM FOAM STRAP LRG (SOFTGOODS) ×3 IMPLANT
SLING ARM IMMOBILIZER LRG (SOFTGOODS) IMPLANT
SLING ARM IMMOBILIZER MED (SOFTGOODS) ×3 IMPLANT
SLING ARM MED ADULT FOAM STRAP (SOFTGOODS) IMPLANT
SLING ARM XL FOAM STRAP (SOFTGOODS) IMPLANT
SPONGE LAP 18X18 X RAY DECT (DISPOSABLE) ×6 IMPLANT
STAPLER VISISTAT 35W (STAPLE) IMPLANT
SUCTION FRAZIER HANDLE 10FR (MISCELLANEOUS) ×2
SUCTION TUBE FRAZIER 10FR DISP (MISCELLANEOUS) ×1 IMPLANT
SUPPORT WRAP ARM LG (MISCELLANEOUS) IMPLANT
SUT ETHILON 3 0 PS 1 (SUTURE) IMPLANT
SUT FIBERWIRE #2 38 T-5 BLUE (SUTURE)
SUT MNCRL AB 4-0 PS2 18 (SUTURE) ×3 IMPLANT
SUT MON AB 2-0 CT1 36 (SUTURE) ×3 IMPLANT
SUT RETRIEVER MED (INSTRUMENTS) IMPLANT
SUT VIC AB 0 CT1 27 (SUTURE) ×3
SUT VIC AB 0 CT1 27XBRD ANBCTR (SUTURE) IMPLANT
SUT VIC AB 2-0 SH 27 (SUTURE)
SUT VIC AB 2-0 SH 27XBRD (SUTURE) IMPLANT
SUT VIC AB 3-0 SH 27 (SUTURE)
SUT VIC AB 3-0 SH 27X BRD (SUTURE) IMPLANT
SUT VICRYL 3-0 CR8 SH (SUTURE) IMPLANT
SUTURE FIBERWR #2 38 T-5 BLUE (SUTURE) IMPLANT
SYR BULB 3OZ (MISCELLANEOUS) ×3 IMPLANT
TOWEL OR 17X24 6PK STRL BLUE (TOWEL DISPOSABLE) ×3 IMPLANT
TOWEL OR NON WOVEN STRL DISP B (DISPOSABLE) ×3 IMPLANT
YANKAUER SUCT BULB TIP NO VENT (SUCTIONS) ×3 IMPLANT

## 2016-12-16 NOTE — Discharge Instructions (Signed)
Diet: As you were doing prior to hospitalization   Dressing:  Keep dressings on and dry until follow up.  Activity:  Increase activity slowly as tolerated, but follow the weight bearing instructions below.  The rules on driving is that you can not be taking narcotics while you drive, and you must feel in control of the vehicle.    Weight Bearing:   Non weight bearing left arm.  Maintain sling full time.  To prevent constipation: you may use a stool softener such as -  Colace (over the counter) 100 mg by mouth twice a day  Drink plenty of fluids (prune juice may be helpful) and high fiber foods Miralax (over the counter) for constipation as needed.    Itching:  If you experience itching with your medications, try taking only a single pain pill, or even half a pain pill at a time.  You may take up to 10 pain pills per day, and you can also use benadryl over the counter for itching or also to help with sleep.   Precautions:  If you experience chest pain or shortness of breath - call 911 immediately for transfer to the hospital emergency department!!  If you develop a fever greater that 101 F, purulent drainage from wound, increased redness or drainage from wound, or calf pain -- Call the office at [phone]                                                 Follow- Up Appointment:  Please call for an appointment to be seen in 2 weeks [place] - [phone]    Post Anesthesia Home Care Instructions  Activity: Get plenty of rest for the remainder of the day. A responsible individual must stay with you for 24 hours following the procedure.  For the next 24 hours, DO NOT: -Drive a car -Advertising copywriter -Drink alcoholic beverages -Take any medication unless instructed by your physician -Make any legal decisions or sign important papers.  Meals: Start with liquid foods such as gelatin or soup. Progress to regular foods as tolerated. Avoid greasy, spicy, heavy foods. If nausea and/or  vomiting occur, drink only clear liquids until the nausea and/or vomiting subsides. Call your physician if vomiting continues.  Special Instructions/Symptoms: Your throat may feel dry or sore from the anesthesia or the breathing tube placed in your throat during surgery. If this causes discomfort, gargle with warm salt water. The discomfort should disappear within 24 hours.  If you had a scopolamine patch placed behind your ear for the management of post- operative nausea and/or vomiting:  1. The medication in the patch is effective for 72 hours, after which it should be removed.  Wrap patch in a tissue and discard in the trash. Wash hands thoroughly with soap and water. 2. You may remove the patch earlier than 72 hours if you experience unpleasant side effects which may include dry mouth, dizziness or visual disturbances. 3. Avoid touching the patch. Wash your hands with soap and water after contact with the patch.     Regional Anesthesia Blocks  1. Numbness or the inability to move the "blocked" extremity may last from 3-48 hours after placement. The length of time depends on the medication injected and your individual response to the medication. If the numbness is not going away after 48 hours, call your surgeon.  2. The extremity that is blocked will need to be protected until the numbness is gone and the  Strength has returned. Because you cannot feel it, you will need to take extra care to avoid injury. Because it may be weak, you may have difficulty moving it or using it. You may not know what position it is in without looking at it while the block is in effect.  3. For blocks in the legs and feet, returning to weight bearing and walking needs to be done carefully. You will need to wait until the numbness is entirely gone and the strength has returned. You should be able to move your leg and foot normally before you try and bear weight or walk. You will need someone to be with you when you  first try to ensure you do not fall and possibly risk injury.  4. Bruising and tenderness at the needle site are common side effects and will resolve in a few days.  5. Persistent numbness or new problems with movement should be communicated to the surgeon or the [facility] [phone] [facility] [phone]).

## 2016-12-16 NOTE — Anesthesia Procedure Notes (Signed)
Procedure Name: Intubation Date/Time: 12/16/2016 10:10 AM Performed by: Lyndee Leo Pre-anesthesia Checklist: Patient identified, Emergency Drugs available, Suction available and Patient being monitored Patient Re-evaluated:Patient Re-evaluated prior to induction Oxygen Delivery Method: Circle system utilized Preoxygenation: Pre-oxygenation with 100% oxygen Induction Type: IV induction Ventilation: Mask ventilation without difficulty Laryngoscope Size: Mac and 3 Grade View: Grade III Tube type: Oral Tube size: 7.0 mm Number of attempts: 1 Airway Equipment and Method: Stylet and Oral airway Placement Confirmation: ETT inserted through vocal cords under direct vision,  positive ETCO2 and breath sounds checked- equal and bilateral Secured at: 22 cm Tube secured with: Tape Dental Injury: Teeth and Oropharynx as per pre-operative assessment
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
## 2016-12-16 NOTE — Anesthesia Postprocedure Evaluation (Signed)
Anesthesia Post Note  Patient: Deanna Singh  Procedure(s) Performed: Procedure(s) (LRB): OPEN REDUCTION INTERNAL FIXATION (ORIF) LEFT PROXIMAL HUMERUS FRACTURE (Left)     Patient location during evaluation: PACU Anesthesia Type: General Level of consciousness: awake and alert Pain management: pain level controlled Vital Signs Assessment: post-procedure vital signs reviewed and stable Respiratory status: spontaneous breathing, nonlabored ventilation and respiratory function stable Cardiovascular status: blood pressure returned to baseline and stable Postop Assessment: no signs of nausea or vomiting Anesthetic complications: no    Last Vitals:  Vitals:   12/16/16 1215 12/16/16 1245  BP: 137/86 (!) 150/87  Pulse: 81 83  Resp: 14 16  Temp:  36.9 C  SpO2: 99% 100%    Last Pain:  Vitals:   12/16/16 1245  TempSrc:   PainSc: 0-No pain                 Beryle Lathe

## 2016-12-16 NOTE — Progress Notes (Signed)
Assisted Dr. Brock with left, ultrasound guided, supraclavicular block. Side rails up, monitors on throughout procedure. See vital signs in flow sheet. Tolerated Procedure well. 

## 2016-12-16 NOTE — Op Note (Signed)
12/16/2016  1:17 PM  PATIENT:  Deanna Singh    PRE-OPERATIVE DIAGNOSIS:  Left proximal humerus fracture  POST-OPERATIVE DIAGNOSIS:  Same  PROCEDURE:  OPEN REDUCTION INTERNAL FIXATION (ORIF) LEFT PROXIMAL HUMERUS FRACTURE  SURGEON:  Bernadetta Roell, Jewel Baize, MD  PHYSICIAN ASSISTANT: Aquilla Hacker, PA-C, he was present and scrubbed throughout the case, critical for completion in a timely fashion, and for retraction, instrumentation, and closure.   ANESTHESIA:   General  PREOPERATIVE INDICATIONS:  Deanna Singh is a  51 y.o. female with a diagnosis of Left proximal humerus fracture who elected for surgical management.    The risks benefits and alternatives were discussed with the patient including but not limited to the risks of nonoperative treatment, versus surgical intervention including infection, bleeding, nerve injury, malunion, nonunion, the need for revision surgery, hardware prominence, hardware failure, the need for hardware removal, blood clots, cardiopulmonary complications, conversion to arthroplasty, morbidity, mortality, among others, and they were willing to proceed.  Predicted outcome is good, although there will be at least a six to nine month expected recovery.    OPERATIVE IMPLANTS: Biomet S3 locking plate  OPERATIVE FINDINGS: Displaced proximal humerus fracture  OPERATIVE PROCEDURE: The patient was brought to the operating room and placed in the supine position. General anesthesia was administered. IV antibiotics were given. She was placed in the beach chair position. All bony prominences were padded. The upper extremity was prepped and draped in usual sterile fashion. Deltopectoral incision was performed.  I exposed the fracture site, and placed deep retractors. I did not tenotomize the biceps tendon. This was left in place. I elevated a small portion of the deltoid off of the shaft, in order to gain access for the plate. I placed supraspinatus and subscapularis  stitches, and then reduced the head onto the shaft. This was maintained in satisfactory position.  I applied the plate and secured it into the sliding hole first. I confirmed position of the reduction and the plate with C-arm, and I placed a total of 2 guidewires into the appropriate position in the head. I was satisfied that the plate was distal appropriately, and then secured the plate proximally with smooth pegs, taking care to prevent penetration into the arch articular surface, using C-arm, as well as manual feel using a hand drill.  I then secured the plate distally using another cortical screw. Once complete fixation and reduction of been achieved, took final C-arm pictures, and irrigated the wounds copiously, and repaired the deltopectoral interval with Vicryl followed by Vicryl for the subcutaneous tissue with Monocryl and Steri-Strips for the skin. She was placed in a sling. She had a preoperative regional block as well. She tolerated the procedure well with no complications.   POST OPERATIVE PLAN: Sling full time, DVT px: Ambulation and foot pumps

## 2016-12-16 NOTE — Anesthesia Procedure Notes (Signed)
Anesthesia Regional Block: Supraclavicular block   Pre-Anesthetic Checklist: ,, timeout performed, Correct Patient, Correct Site, Correct Laterality, Correct Procedure, Correct Position, site marked, Risks and benefits discussed,  Surgical consent,  Pre-op evaluation,  At surgeon's request and post-op pain management  Laterality: Left  Prep: chloraprep       Needles:  Injection technique: Single-shot  Needle Type: Echogenic Needle     Needle Length: 9cm  Needle Gauge: 21     Additional Needles:   Procedures: ultrasound guided,,,,,,,,  Narrative:  Start time: 12/16/2016 9:39 AM End time: 12/16/2016 9:45 AM Injection made incrementally with aspirations every 5 mL.  Performed by: Personally  Anesthesiologist: Leslye Peer E  Additional Notes: No pain on injection. No increased resistance to injection. Injection made in 5cc increments. Good needle visualization. Patient tolerated the procedure well.

## 2016-12-16 NOTE — Transfer of Care (Signed)
Immediate Anesthesia Transfer of Care Note  Patient: Deanna Singh  Procedure(s) Performed: Procedure(s): OPEN REDUCTION INTERNAL FIXATION (ORIF) LEFT PROXIMAL HUMERUS FRACTURE (Left)  Patient Location: PACU  Anesthesia Type:GA combined with regional for post-op pain  Level of Consciousness: awake, sedated and patient cooperative  Airway & Oxygen Therapy: Patient Spontanous Breathing and Patient connected to face mask oxygen  Post-op Assessment: Report given to RN and Post -op Vital signs reviewed and stable  Post vital signs: Reviewed and stable  Last Vitals:  Vitals:   12/16/16 0945 12/16/16 0950  BP: 130/86   Pulse: 96 100  Resp: 13 20  Temp:    SpO2: 100% 100%    Last Pain:  Vitals:   12/16/16 0850  TempSrc: Oral  PainSc: 8       Patients Stated Pain Goal: 3 (12/16/16 0850)  Complications: No apparent anesthesia complications

## 2016-12-17 ENCOUNTER — Encounter (HOSPITAL_BASED_OUTPATIENT_CLINIC_OR_DEPARTMENT_OTHER): Payer: Self-pay | Admitting: Orthopedic Surgery

## 2016-12-17 NOTE — Addendum Note (Signed)
Addendum  created 12/17/16 [id number] by Lance CoonWebster, [place], CRNA   Charge Capture section accepted

## 2018-05-10 ENCOUNTER — Encounter (HOSPITAL_COMMUNITY): Payer: Self-pay | Admitting: Emergency Medicine

## 2018-05-10 ENCOUNTER — Emergency Department (HOSPITAL_COMMUNITY)
Admission: EM | Admit: 2018-05-10 | Discharge: 2018-05-10 | Disposition: A | Discharge status: Home / Self Care | Payer: Medicare Other | Attending: Emergency Medicine | Admitting: Emergency Medicine

## 2018-05-10 ENCOUNTER — Emergency Department (HOSPITAL_COMMUNITY): Payer: Medicare Other

## 2018-05-10 DIAGNOSIS — E876 Hypokalemia: Secondary | ICD-10-CM

## 2018-05-10 DIAGNOSIS — M25551 Pain in right hip: Secondary | ICD-10-CM | POA: Insufficient documentation

## 2018-05-10 DIAGNOSIS — Z79899 Other long term (current) drug therapy: Secondary | ICD-10-CM | POA: Insufficient documentation

## 2018-05-10 DIAGNOSIS — I10 Essential (primary) hypertension: Secondary | ICD-10-CM | POA: Diagnosis not present

## 2018-05-10 DIAGNOSIS — M25511 Pain in right shoulder: Secondary | ICD-10-CM | POA: Insufficient documentation

## 2018-05-10 DIAGNOSIS — F17210 Nicotine dependence, cigarettes, uncomplicated: Secondary | ICD-10-CM | POA: Insufficient documentation

## 2018-05-10 LAB — I-STAT CHEM 8, ED
BUN: 14 mg/dL (ref 6–20)
Calcium, Ion: 1.17 mmol/L (ref 1.15–1.40)
Chloride: 108 mmol/L (ref 98–111)
Creatinine, Ser: 0.5 mg/dL (ref 0.44–1.00)
Glucose, Bld: 80 mg/dL (ref 70–99)
HCT: 38 % (ref 36.0–46.0)
Hemoglobin: 12.9 g/dL (ref 12.0–15.0)
Potassium: 3.3 mmol/L — ABNORMAL LOW (ref 3.5–5.1)
Sodium: 141 mmol/L (ref 135–145)
TCO2: 23 mmol/L (ref 22–32)

## 2018-05-10 MED ORDER — ACETAMINOPHEN 325 MG PO TABS: 650.0000 mg | ORAL_TABLET | Freq: Four times a day (QID) | ORAL | 0 refills | Status: AC | PRN

## 2018-05-10 MED ORDER — IBUPROFEN 600 MG PO TABS: 600.0000 mg | ORAL_TABLET | Freq: Four times a day (QID) | ORAL | 0 refills | Status: AC | PRN

## 2018-05-10 MED ORDER — OXYCODONE-ACETAMINOPHEN 5-325 MG PO TABS
1.0000 | ORAL_TABLET | Freq: Once | ORAL | Status: AC
  Administered 2018-05-10: 1 via ORAL
  Filled 2018-05-10: qty 1

## 2018-05-10 MED ORDER — POTASSIUM CHLORIDE ER 20 MEQ PO TBCR: 20.0000 meq | EXTENDED_RELEASE_TABLET | Freq: Every day | ORAL | 0 refills | Status: DC

## 2018-05-10 MED ORDER — HYDROCHLOROTHIAZIDE 25 MG PO TABS: 25.0000 mg | ORAL_TABLET | Freq: Every day | ORAL | 0 refills | Status: DC

## 2018-05-10 NOTE — Discharge Instructions (Addendum)
Please read attached information. If you experience any new or worsening signs or symptoms please return to the emergency room for evaluation. Please follow-up with your primary care provider or specialist as discussed. Please use medication prescribed only as directed and discontinue taking if you have any concerning signs or symptoms.   °

## 2018-05-10 NOTE — ED Notes (Signed)
Patient transported to x-ray. ?
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
## 2018-05-10 NOTE — ED Triage Notes (Signed)
Patient c/o pain to her R side, from "right arm down to right leg and foot" x 3 weeks. Denies injury, reports trying heat and ice and OTC medications without relief. Worse with movement and ambulating. Full ROM noted all extremities.

## 2018-05-23 ENCOUNTER — Emergency Department (HOSPITAL_COMMUNITY)
Admission: EM | Admit: 2018-05-23 | Discharge: 2018-05-24 | Disposition: A | Discharge status: Home / Self Care | Payer: Medicare Other | Attending: Emergency Medicine | Admitting: Emergency Medicine

## 2018-05-23 ENCOUNTER — Encounter (HOSPITAL_COMMUNITY): Payer: Self-pay

## 2018-05-23 ENCOUNTER — Other Ambulatory Visit: Payer: Self-pay

## 2018-05-23 DIAGNOSIS — J209 Acute bronchitis, unspecified: Secondary | ICD-10-CM

## 2018-05-23 DIAGNOSIS — J111 Influenza due to unidentified influenza virus with other respiratory manifestations: Secondary | ICD-10-CM

## 2018-05-23 DIAGNOSIS — R55 Syncope and collapse: Secondary | ICD-10-CM | POA: Diagnosis not present

## 2018-05-23 DIAGNOSIS — J101 Influenza due to other identified influenza virus with other respiratory manifestations: Secondary | ICD-10-CM | POA: Insufficient documentation

## 2018-05-23 DIAGNOSIS — Z79899 Other long term (current) drug therapy: Secondary | ICD-10-CM | POA: Insufficient documentation

## 2018-05-23 DIAGNOSIS — I10 Essential (primary) hypertension: Secondary | ICD-10-CM | POA: Diagnosis not present

## 2018-05-23 DIAGNOSIS — R69 Illness, unspecified: Secondary | ICD-10-CM

## 2018-05-23 DIAGNOSIS — F17210 Nicotine dependence, cigarettes, uncomplicated: Secondary | ICD-10-CM | POA: Diagnosis not present

## 2018-05-23 MED ORDER — SODIUM CHLORIDE 0.9 % IV BOLUS
1000.0000 mL | Freq: Once | INTRAVENOUS | Status: AC
  Administered 2018-05-24: 1000 mL via INTRAVENOUS

## 2018-05-23 MED ORDER — SODIUM CHLORIDE 0.9% FLUSH
3.0000 mL | Freq: Once | INTRAVENOUS | Status: AC
  Administered 2018-05-24: 3 mL via INTRAVENOUS
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
## 2018-05-23 NOTE — ED Triage Notes (Signed)
Pt comes via [facility] from home, syncopal episode with fall, LOC for appx 2 minutes. C/o of neck pain and nausea. Has been having flu like symptoms for the past two weeks. PTA received 4 mg zofran

## 2018-05-24 ENCOUNTER — Emergency Department (HOSPITAL_COMMUNITY): Payer: Medicare Other

## 2018-05-24 DIAGNOSIS — R55 Syncope and collapse: Secondary | ICD-10-CM | POA: Diagnosis not present

## 2018-05-24 LAB — URINALYSIS, ROUTINE W REFLEX MICROSCOPIC
Bilirubin Urine: NEGATIVE
Glucose, UA: NEGATIVE mg/dL
Hgb urine dipstick: NEGATIVE
Ketones, ur: NEGATIVE mg/dL
Leukocytes, UA: NEGATIVE
Nitrite: NEGATIVE
Protein, ur: NEGATIVE mg/dL
Specific Gravity, Urine: 1.012 (ref 1.005–1.030)
pH: 5 (ref 5.0–8.0)

## 2018-05-24 LAB — CBC
HCT: 35.3 % — ABNORMAL LOW (ref 36.0–46.0)
Hemoglobin: 11.6 g/dL — ABNORMAL LOW (ref 12.0–15.0)
MCH: 32.1 pg (ref 26.0–34.0)
MCHC: 32.9 g/dL (ref 30.0–36.0)
MCV: 97.8 fL (ref 80.0–100.0)
NRBC: 0 % (ref 0.0–0.2)
Platelets: 204 10*3/uL (ref 150–400)
RBC: 3.61 MIL/uL — ABNORMAL LOW (ref 3.87–5.11)
RDW: 13.9 % (ref 11.5–15.5)
WBC: 4.9 10*3/uL (ref 4.0–10.5)

## 2018-05-24 LAB — DIFFERENTIAL
ABS IMMATURE GRANULOCYTES: 0.01 10*3/uL (ref 0.00–0.07)
Basophils Absolute: 0.1 10*3/uL (ref 0.0–0.1)
Basophils Relative: 1 %
Eosinophils Absolute: 0.1 10*3/uL (ref 0.0–0.5)
Eosinophils Relative: 1 %
Immature Granulocytes: 0 %
Lymphocytes Relative: 46 %
Lymphs Abs: 2.3 10*3/uL (ref 0.7–4.0)
Monocytes Absolute: 0.7 10*3/uL (ref 0.1–1.0)
Monocytes Relative: 14 %
Neutro Abs: 1.9 10*3/uL (ref 1.7–7.7)
Neutrophils Relative %: 38 %

## 2018-05-24 LAB — COMPREHENSIVE METABOLIC PANEL
ALT: 41 U/L (ref 0–44)
AST: 31 U/L (ref 15–41)
Albumin: 3.9 g/dL (ref 3.5–5.0)
Alkaline Phosphatase: 69 U/L (ref 38–126)
Anion gap: 14 (ref 5–15)
BUN: 11 mg/dL (ref 6–20)
CO2: 21 mmol/L — ABNORMAL LOW (ref 22–32)
Calcium: 9 mg/dL (ref 8.9–10.3)
Chloride: 105 mmol/L (ref 98–111)
Creatinine, Ser: 0.55 mg/dL (ref 0.44–1.00)
GFR calc Af Amer: >60 mL/min (ref >60)
GFR calc non Af Amer: >60 mL/min (ref >60)
Glucose, Bld: 102 mg/dL — ABNORMAL HIGH (ref 70–99)
POTASSIUM: 3.2 mmol/L — AB (ref 3.5–5.1)
SODIUM: 140 mmol/L (ref 135–145)
Total Bilirubin: <0.1 mg/dL — ABNORMAL LOW (ref 0.3–1.2)
Total Protein: 7.1 g/dL (ref 6.5–8.1)

## 2018-05-24 LAB — I-STAT BETA HCG BLOOD, ED (MC, WL, AP ONLY): I-stat hCG, quantitative: <5 m[IU]/mL (ref <5)

## 2018-05-24 MED ORDER — OSELTAMIVIR PHOSPHATE 75 MG PO CAPS: 75.0000 mg | ORAL_CAPSULE | Freq: Two times a day (BID) | ORAL | 0 refills | Status: DC

## 2018-05-24 MED ORDER — OSELTAMIVIR PHOSPHATE 75 MG PO CAPS: 75.0000 mg | ORAL_CAPSULE | Freq: Once | ORAL | Status: DC

## 2018-05-24 MED ORDER — ALBUTEROL SULFATE HFA 108 (90 BASE) MCG/ACT IN AERS: 2.0000 | INHALATION_SPRAY | RESPIRATORY_TRACT | Status: DC | PRN

## 2018-05-24 MED ORDER — BENZONATATE 200 MG PO CAPS: 200.0000 mg | ORAL_CAPSULE | Freq: Three times a day (TID) | ORAL | 0 refills | Status: DC | PRN

## 2018-05-24 NOTE — ED Notes (Signed)
Pt ambulatory to and from hallway bathroom with shuffling but steady gait. Standby assistance only.

## 2018-05-24 NOTE — ED Notes (Signed)
Pt updated by EDP, but AVS not available yet. Pt states she's ready to go home and doesn't wish to wait for her papers. IV removed. Pt encouraged to f/u with PCP, or to return to ED if having new or worsening s/sx. Pt states understanding and departs in NAD, refusing use of wheelchair.

## 2018-05-24 NOTE — ED Notes (Signed)
Patient transported to CT 

## 2018-05-24 NOTE — ED Notes (Signed)
Patient transported to X-ray 

## 2018-10-05 ENCOUNTER — Emergency Department (HOSPITAL_COMMUNITY): Payer: Medicare Other

## 2018-10-05 ENCOUNTER — Other Ambulatory Visit: Payer: Self-pay

## 2018-10-05 ENCOUNTER — Encounter (HOSPITAL_COMMUNITY): Payer: Self-pay | Admitting: Emergency Medicine

## 2018-10-05 ENCOUNTER — Emergency Department (HOSPITAL_COMMUNITY)
Admission: EM | Admit: 2018-10-05 | Discharge: 2018-10-05 | Disposition: A | Discharge status: Home / Self Care | Payer: Medicare Other | Attending: Emergency Medicine | Admitting: Emergency Medicine

## 2018-10-05 DIAGNOSIS — Z760 Encounter for issue of repeat prescription: Secondary | ICD-10-CM | POA: Insufficient documentation

## 2018-10-05 DIAGNOSIS — I10 Essential (primary) hypertension: Secondary | ICD-10-CM

## 2018-10-05 DIAGNOSIS — R42 Dizziness and giddiness: Secondary | ICD-10-CM | POA: Diagnosis not present

## 2018-10-05 LAB — BASIC METABOLIC PANEL
Anion gap: 11 (ref 5–15)
BUN: 7 mg/dL (ref 6–20)
CO2: 21 mmol/L — ABNORMAL LOW (ref 22–32)
Calcium: 9.7 mg/dL (ref 8.9–10.3)
Chloride: 104 mmol/L (ref 98–111)
Creatinine, Ser: 0.59 mg/dL (ref 0.44–1.00)
GFR calc Af Amer: >60 mL/min (ref >60)
GFR calc non Af Amer: >60 mL/min (ref >60)
Glucose, Bld: 98 mg/dL (ref 70–99)
Potassium: 3.6 mmol/L (ref 3.5–5.1)
Sodium: 136 mmol/L (ref 135–145)

## 2018-10-05 LAB — CBC WITH DIFFERENTIAL/PLATELET
Abs Immature Granulocytes: 0.01 10*3/uL (ref 0.00–0.07)
Basophils Absolute: 0 10*3/uL (ref 0.0–0.1)
Basophils Relative: 1 %
Eosinophils Absolute: 0.1 10*3/uL (ref 0.0–0.5)
Eosinophils Relative: 3 %
HCT: 38.8 % (ref 36.0–46.0)
Hemoglobin: 13.4 g/dL (ref 12.0–15.0)
Immature Granulocytes: 0 %
Lymphocytes Relative: 32 %
Lymphs Abs: 1.3 10*3/uL (ref 0.7–4.0)
MCH: 32.7 pg (ref 26.0–34.0)
MCHC: 34.5 g/dL (ref 30.0–36.0)
MCV: 94.6 fL (ref 80.0–100.0)
Monocytes Absolute: 0.2 10*3/uL (ref 0.1–1.0)
Monocytes Relative: 4 %
Neutro Abs: 2.4 10*3/uL (ref 1.7–7.7)
Neutrophils Relative %: 60 %
Platelets: 135 10*3/uL — ABNORMAL LOW (ref 150–400)
RBC: 4.1 MIL/uL (ref 3.87–5.11)
RDW: 13.4 % (ref 11.5–15.5)
WBC: 4.1 10*3/uL (ref 4.0–10.5)
nRBC: 0 % (ref 0.0–0.2)

## 2018-10-05 MED ORDER — HYDROCHLOROTHIAZIDE 25 MG PO TABS: 25.0000 mg | ORAL_TABLET | Freq: Every day | ORAL | 0 refills | Status: DC

## 2018-10-05 MED ORDER — MECLIZINE HCL 12.5 MG PO TABS: 12.5000 mg | ORAL_TABLET | Freq: Three times a day (TID) | ORAL | 0 refills | Status: AC | PRN

## 2018-10-05 MED ORDER — MECLIZINE HCL 25 MG PO TABS
25.0000 mg | ORAL_TABLET | Freq: Once | ORAL | Status: AC
  Administered 2018-10-05: 25 mg via ORAL
  Filled 2018-10-05: qty 1

## 2018-10-05 NOTE — ED Triage Notes (Signed)
Pt reports she ran out of her BP medication 4 days ago and was unable to get a hold of her PCP to call her medications in. Pt did not take her bp at home but states she felt a little dizzy and like it may be a little high.

## 2018-10-05 NOTE — Discharge Instructions (Signed)
Take your medications as prescribed.  You can take meclizine up to 3 times daily as needed for dizziness.  Drink plenty water and get plenty of rest.  Follow-up with your primary care provider for reevaluation of your high blood pressure and your dizziness.  Return to the emergency department if any concerning signs or symptoms develop such as severe headaches, fevers, chest pains, weakness to one side of the body, altered mental status, persistent vomiting, or passing out.
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
## 2018-10-05 NOTE — ED Notes (Signed)
Patient verbalizes understanding of discharge instructions. Opportunity for questioning and answers were provided. Armband removed by staff, pt discharged from ED ambulatory to home.  

## 2019-04-02 ENCOUNTER — Encounter (HOSPITAL_COMMUNITY): Payer: Self-pay

## 2019-04-02 ENCOUNTER — Other Ambulatory Visit: Payer: Self-pay

## 2019-04-02 ENCOUNTER — Ambulatory Visit (HOSPITAL_COMMUNITY)
Admission: EM | Admit: 2019-04-02 | Discharge: 2019-04-02 | Disposition: A | Discharge status: Home / Self Care | Payer: Medicare Other | Attending: Family | Admitting: Family

## 2019-04-02 DIAGNOSIS — R519 Headache, unspecified: Secondary | ICD-10-CM

## 2019-04-02 DIAGNOSIS — Z760 Encounter for issue of repeat prescription: Secondary | ICD-10-CM

## 2019-04-02 DIAGNOSIS — G4701 Insomnia due to medical condition: Secondary | ICD-10-CM | POA: Diagnosis not present

## 2019-04-02 DIAGNOSIS — I10 Essential (primary) hypertension: Secondary | ICD-10-CM

## 2019-04-02 DIAGNOSIS — R42 Dizziness and giddiness: Secondary | ICD-10-CM

## 2019-04-02 MED ORDER — HYDROCHLOROTHIAZIDE 25 MG PO TABS: 25.0000 mg | ORAL_TABLET | Freq: Every day | ORAL | 2 refills | Status: AC

## 2019-04-02 MED ORDER — POTASSIUM CHLORIDE ER 20 MEQ PO TBCR: 20.0000 meq | EXTENDED_RELEASE_TABLET | Freq: Every day | ORAL | 2 refills | Status: AC

## 2019-04-02 MED ORDER — AMITRIPTYLINE HCL 50 MG PO TABS: 50.0000 mg | ORAL_TABLET | Freq: Every day | ORAL | 2 refills | Status: AC
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
## 2019-04-02 NOTE — Discharge Instructions (Addendum)
Recommend restart Hydochlorothiazide (HCTZ) blood pressure pill once daily. Restart Potassium pills daily. Continue to monitor your blood pressure. May restart Amitriptyline 50mg  at bedtime to help you sleep. If your blood pressure remains above 160 over 100, go to the ER for further evaluation. May take Ibuprofen 600mg  every 8 hours or Tylenol 1000mg  every 8 hours as needed for headache. If headache and dizziness continue, go to the ER for further evaluation. Otherwise, follow-up with a PCP [facility]) as soon as possible for continued management of blood pressure and symptoms.

## 2019-04-02 NOTE — ED Triage Notes (Signed)
Pt presents for medication refill on blood pressure medication; pt states she has been without medication for about 3 weeks and has had dizziness and headache.

## 2019-09-06 ENCOUNTER — Other Ambulatory Visit: Payer: Self-pay | Admitting: Family Medicine

## 2019-09-06 DIAGNOSIS — Z1231 Encounter for screening mammogram for malignant neoplasm of breast: Secondary | ICD-10-CM

## 2019-09-06 DIAGNOSIS — Z13820 Encounter for screening for osteoporosis: Secondary | ICD-10-CM

## 2019-09-17 ENCOUNTER — Ambulatory Visit: Payer: Medicare Other | Attending: Internal Medicine

## 2019-09-17 DIAGNOSIS — Z23 Encounter for immunization: Secondary | ICD-10-CM

## 2019-09-17 NOTE — Progress Notes (Signed)
   Covid-19 Vaccination Clinic  Name:  LANIQUA TORRENS    MRN: [id number] DOB: 11/06/1965  09/17/2019  Ms. Fread was observed post Covid-19 immunization for 15 minutes without incident. She was provided with Vaccine Information Sheet and instruction to access the V-Safe system.   Ms. Tassinari was instructed to call 911 with any severe reactions post vaccine: Marland Kitchen Difficulty breathing  . Swelling of face and throat  . A fast heartbeat  . A bad rash all over body  . Dizziness and weakness   Immunizations Administered    Name Date Dose VIS Date Route   Pfizer COVID-19 Vaccine 09/17/2019  8:25 AM 0.3 mL 06/15/2018 Intramuscular   Manufacturer: [employer], [employer]   Lot: [id number]   NDC: [address]-9

## 2019-10-10 ENCOUNTER — Ambulatory Visit: Payer: Medicare Other

## 2019-10-10 ENCOUNTER — Emergency Department (HOSPITAL_COMMUNITY)
Admission: EM | Admit: 2019-10-10 | Discharge: 2019-10-10 | Disposition: A | Discharge status: Home / Self Care | Payer: Medicare Other | Attending: Emergency Medicine | Admitting: Emergency Medicine

## 2019-10-10 ENCOUNTER — Encounter (HOSPITAL_COMMUNITY): Payer: Self-pay | Admitting: Emergency Medicine

## 2019-10-10 DIAGNOSIS — R0789 Other chest pain: Secondary | ICD-10-CM | POA: Insufficient documentation

## 2019-10-10 DIAGNOSIS — Z5321 Procedure and treatment not carried out due to patient leaving prior to being seen by health care provider: Secondary | ICD-10-CM | POA: Insufficient documentation

## 2019-10-10 MED ORDER — SODIUM CHLORIDE 0.9% FLUSH: 3.0000 mL | Freq: Once | INTRAVENOUS | Status: DC

## 2019-10-10 NOTE — ED Triage Notes (Signed)
20 g in left arm . Baby ASA 324mg  Ntg 1

## 2019-10-10 NOTE — ED Notes (Signed)
Pt called x 3  No answer. 

## 2019-10-10 NOTE — ED Triage Notes (Signed)
EMS stated, chest pain under her left breast, started about 45 min. The pain woke her from her sleep.

## 2019-10-10 NOTE — ED Notes (Signed)
Denard (Boyfriend [phone]) called/would like for patient to call him back.

## 2019-12-08 ENCOUNTER — Other Ambulatory Visit: Payer: Self-pay | Admitting: Family Medicine

## 2019-12-08 ENCOUNTER — Other Ambulatory Visit: Payer: Self-pay | Admitting: General Practice

## 2019-12-08 DIAGNOSIS — Z1231 Encounter for screening mammogram for malignant neoplasm of breast: Secondary | ICD-10-CM

## 2020-01-10 ENCOUNTER — Telehealth: Payer: Self-pay

## 2020-01-10 NOTE — Telephone Encounter (Signed)
NOTES ON FILE FROM [facility] [phone] SENT REFERRAL TO SCHEDULING 
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
## 2020-01-30 ENCOUNTER — Other Ambulatory Visit: Payer: Self-pay

## 2020-01-30 ENCOUNTER — Ambulatory Visit (INDEPENDENT_AMBULATORY_CARE_PROVIDER_SITE_OTHER): Payer: Medicare Other | Admitting: Cardiology

## 2020-01-30 ENCOUNTER — Encounter: Payer: Self-pay | Admitting: Cardiology

## 2020-01-30 VITALS — BP 112/70 | HR 83 | Ht 63.0 in | Wt 131.0 lb

## 2020-01-30 DIAGNOSIS — I10 Essential (primary) hypertension: Secondary | ICD-10-CM | POA: Diagnosis not present

## 2020-01-30 DIAGNOSIS — Z720 Tobacco use: Secondary | ICD-10-CM | POA: Diagnosis not present

## 2020-01-30 DIAGNOSIS — E7800 Pure hypercholesterolemia, unspecified: Secondary | ICD-10-CM

## 2020-01-30 NOTE — Patient Instructions (Signed)
Medication Instructions:  Your physician recommends that you continue on your current medications as directed. Please refer to the Current Medication list given to you today.  *If you need a refill on your cardiac medications before your next appointment, please call your pharmacy*   Lab Work: None  If you have labs (blood work) drawn today and your tests are completely normal, you will receive your results only by: Marland Kitchen MyChart Message (if you have MyChart) OR . A paper copy in the mail If you have any lab test that is abnormal or we need to change your treatment, we will call you to review the results.   Testing/Procedures: None  Follow-Up: At [facility], you and your health needs are our priority.  As part of our continuing mission to provide you with exceptional heart care, we have created designated Provider Care Teams.  These Care Teams include your primary Cardiologist (physician) and Advanced Practice Providers (APPs -  Physician Assistants and Nurse Practitioners) who all work together to provide you with the care you need, when you need it.  We recommend signing up for the patient portal called "MyChart".  Sign up information is provided on this After Visit Summary.  MyChart is used to connect with patients for Virtual Visits (Telemedicine).  Patients are able to view lab/test results, encounter notes, upcoming appointments, etc.  Non-urgent messages can be sent to your provider as well.   To learn more about what you can do with MyChart, go to [email or web address].    Your next appointment:   12 month(s)  The format for your next appointment:   In Person  Provider:    Laurance Flatten, MD    Other Instructions Your physician has requested that you regularly monitor and record your blood pressure readings at home. Please use the same machine at the same time of day to check your readings and record them.   Steps to Quit Smoking Smoking tobacco is the leading  cause of preventable death. It can affect almost every organ in the body. Smoking puts you and people around you at risk for many serious, long-lasting (chronic) diseases. Quitting smoking can be hard, but it is one of the best things that you can do for your health. It is never too late to quit. How do I get ready to quit? When you decide to quit smoking, make a plan to help you succeed. Before you quit:  Pick a date to quit. Set a date within the next 2 weeks to give you time to prepare.  Write down the reasons why you are quitting. Keep this list in places where you will see it often.  Tell your family, friends, and co-workers that you are quitting. Their support is important.  Talk with your doctor about the choices that may help you quit.  Find out if your health insurance will pay for these treatments.  Know the people, places, things, and activities that make you want to smoke (triggers). Avoid them. What first steps can I take to quit smoking?  Throw away all cigarettes at home, at work, and in your car.  Throw away the things that you use when you smoke, such as ashtrays and lighters.  Clean your car. Make sure to empty the ashtray.  Clean your home, including curtains and carpets. What can I do to help me quit smoking? Talk with your doctor about taking medicines and seeing a counselor at the same time. You are more likely to succeed when  you do both.  If you are pregnant or breastfeeding, talk with your doctor about counseling or other ways to quit smoking. Do not take medicine to help you quit smoking unless your doctor tells you to do so. To quit smoking: Quit right away  Quit smoking totally, instead of slowly cutting back on how much you smoke over a period of time.  Go to counseling. You are more likely to quit if you go to counseling sessions regularly. Take medicine You may take medicines to help you quit. Some medicines need a prescription, and some you can buy  over-the-counter. Some medicines may contain a drug called nicotine to replace the nicotine in cigarettes. Medicines may:  Help you to stop having the desire to smoke (cravings).  Help to stop the problems that come when you stop smoking (withdrawal symptoms). Your doctor may ask you to use:  Nicotine patches, gum, or lozenges.  Nicotine inhalers or sprays.  Non-nicotine medicine that is taken by mouth. Find resources Find resources and other ways to help you quit smoking and remain smoke-free after you quit. These resources are most helpful when you use them often. They include:  Online chats with a Veterinary surgeon.  Phone quitlines.  Printed Materials engineer.  Support groups or group counseling.  Text messaging programs.  Mobile phone apps. Use apps on your mobile phone or tablet that can help you stick to your quit plan. There are many free apps for mobile phones and tablets as well as websites. Examples include Quit Guide from the [employer] and smokefree.gov  What things can I do to make it easier to quit?   Talk to your family and friends. Ask them to support and encourage you.  Call a phone quitline (1-800-QUIT-NOW), reach out to support groups, or work with a Veterinary surgeon.  Ask people who smoke to not smoke around you.  Avoid places that make you want to smoke, such as: ? Bars. ? Parties. ? Smoke-break areas at work.  Spend time with people who do not smoke.  Lower the stress in your life. Stress can make you want to smoke. Try these things to help your stress: ? Getting regular exercise. ? Doing deep-breathing exercises. ? Doing yoga. ? Meditating. ? Doing a body scan. To do this, close your eyes, focus on one area of your body at a time from head to toe. Notice which parts of your body are tense. Try to relax the muscles in those areas. How will I feel when I quit smoking? Day 1 to 3 weeks Within the first 24 hours, you may start to have some problems that come from  quitting tobacco. These problems are very bad 2-3 days after you quit, but they do not often last for more than 2-3 weeks. You may get these symptoms:  Mood swings.  Feeling restless, nervous, angry, or annoyed.  Trouble concentrating.  Dizziness.  Strong desire for high-sugar foods and nicotine.  Weight gain.  Trouble pooping (constipation).  Feeling like you may vomit (nausea).  Coughing or a sore throat.  Changes in how the medicines that you take for other issues work in your body.  Depression.  Trouble sleeping (insomnia). Week 3 and afterward After the first 2-3 weeks of quitting, you may start to notice more positive results, such as:  Better sense of smell and taste.  Less coughing and sore throat.  Slower heart rate.  Lower blood pressure.  Clearer skin.  Better breathing.  Fewer sick days. Quitting smoking  can be hard. Do not give up if you fail the first time. Some people need to try a few times before they succeed. Do your best to stick to your quit plan, and talk with your doctor if you have any questions or concerns. Summary  Smoking tobacco is the leading cause of preventable death. Quitting smoking can be hard, but it is one of the best things that you can do for your health.  When you decide to quit smoking, make a plan to help you succeed.  Quit smoking right away, not slowly over a period of time.  When you start quitting, seek help from your doctor, family, or friends. This information is not intended to replace advice given to you by your health care provider. Make sure you discuss any questions you have with your health care provider. Document Revised: 12/31/2018 Document Reviewed: 06/26/2018 Elsevier Patient Education  2020 [employer].

## 2020-02-02 ENCOUNTER — Other Ambulatory Visit: Payer: Self-pay

## 2020-02-02 ENCOUNTER — Ambulatory Visit
Admission: RE | Admit: 2020-02-02 | Discharge: 2020-02-02 | Disposition: A | Discharge status: Home / Self Care | Payer: Medicare Other | Source: Ambulatory Visit | Attending: General Practice | Admitting: General Practice

## 2020-02-02 DIAGNOSIS — Z1231 Encounter for screening mammogram for malignant neoplasm of breast: Secondary | ICD-10-CM

## 2020-02-07 ENCOUNTER — Other Ambulatory Visit: Payer: Self-pay | Admitting: General Practice

## 2020-02-07 DIAGNOSIS — R928 Other abnormal and inconclusive findings on diagnostic imaging of breast: Secondary | ICD-10-CM

## 2020-02-17 ENCOUNTER — Other Ambulatory Visit: Payer: Self-pay

## 2020-02-17 ENCOUNTER — Other Ambulatory Visit: Payer: Self-pay | Admitting: General Practice

## 2020-02-17 ENCOUNTER — Ambulatory Visit
Admission: RE | Admit: 2020-02-17 | Discharge: 2020-02-17 | Disposition: A | Discharge status: Home / Self Care | Payer: Medicare Other | Source: Ambulatory Visit | Attending: General Practice | Admitting: General Practice

## 2020-02-17 DIAGNOSIS — R928 Other abnormal and inconclusive findings on diagnostic imaging of breast: Secondary | ICD-10-CM

## 2020-02-17 DIAGNOSIS — R921 Mammographic calcification found on diagnostic imaging of breast: Secondary | ICD-10-CM

## 2020-03-02 ENCOUNTER — Ambulatory Visit
Admission: RE | Admit: 2020-03-02 | Discharge: 2020-03-02 | Disposition: A | Discharge status: Home / Self Care | Payer: Medicare Other | Source: Ambulatory Visit | Attending: General Practice | Admitting: General Practice

## 2020-03-02 ENCOUNTER — Other Ambulatory Visit: Payer: Self-pay

## 2020-03-02 DIAGNOSIS — R921 Mammographic calcification found on diagnostic imaging of breast: Secondary | ICD-10-CM

## 2021-03-20 IMAGING — MG MM BREAST LOCALIZATION CLIP
4 series · 4 of 12 positions shown · non-contrast
Comparison: Previous exam(s).

CLINICAL DATA: Confirmation of clip placement after stereotactic
tomosynthesis core needle biopsy of an indeterminate group of
calcifications in the UPPER INNER QUADRANT of the LEFT breast at
ANTERIOR depth.

EXAM:
DIGITAL 2D AND TOMOSYNTHESIS DIAGNOSTIC LEFT MAMMOGRAM POST
STEREOTACTIC BIOPSY

[L CC synth-2D]
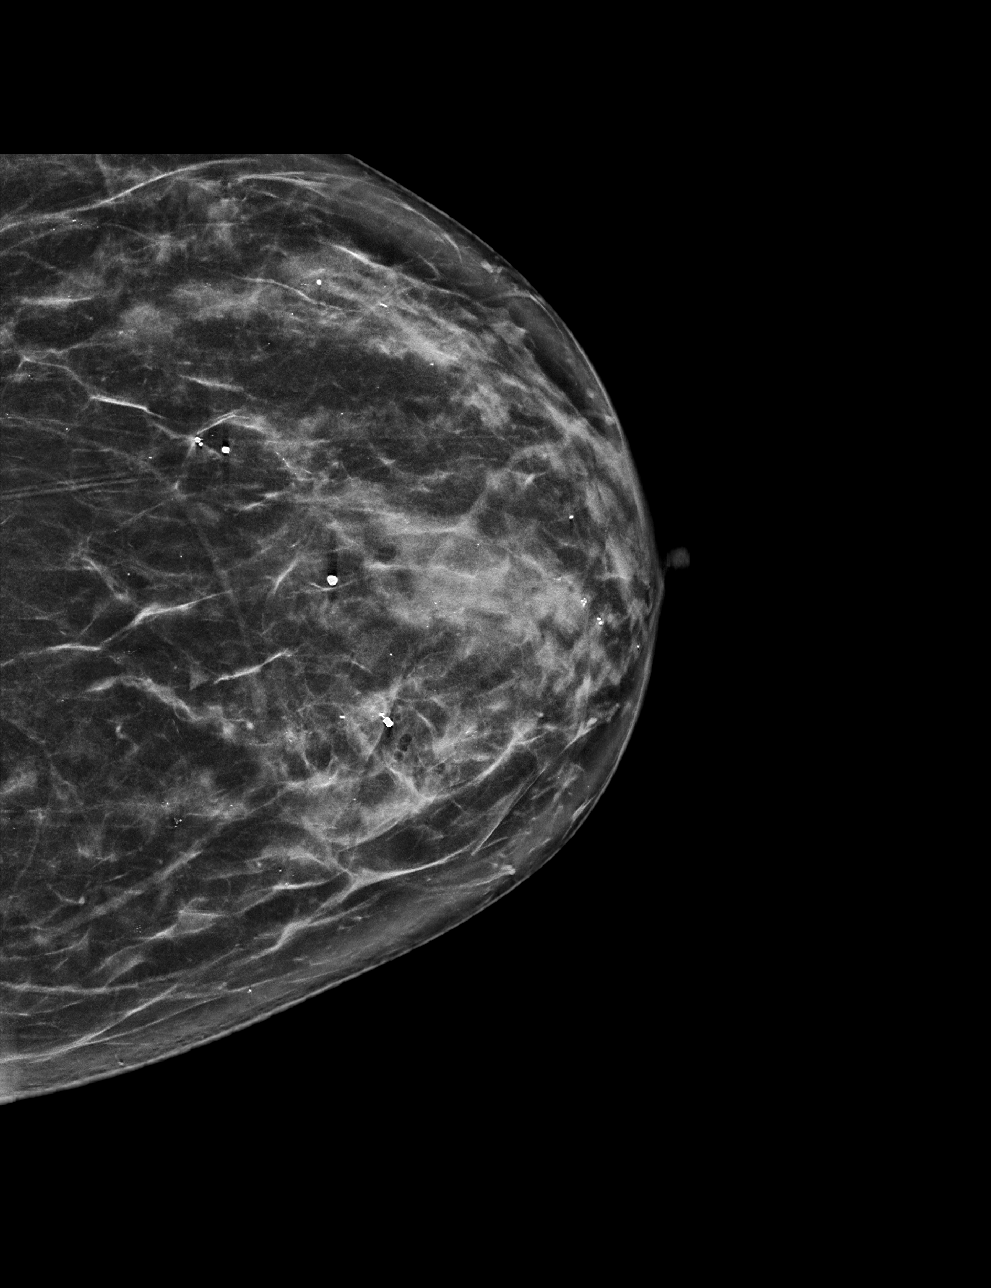

[L ML synth-2D]
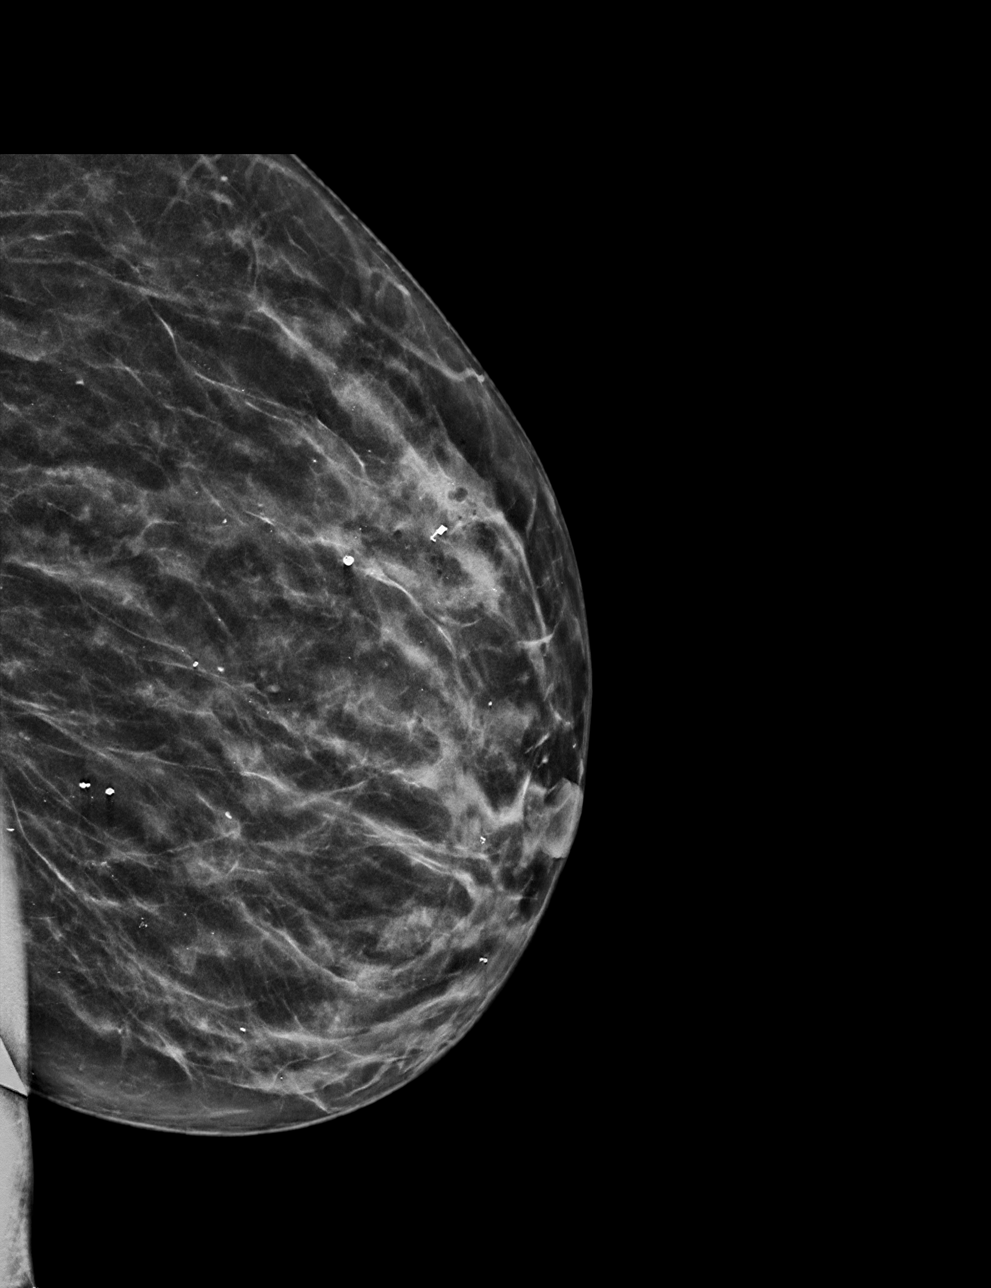

[L CC tomo · tomo slice 30/59.0]
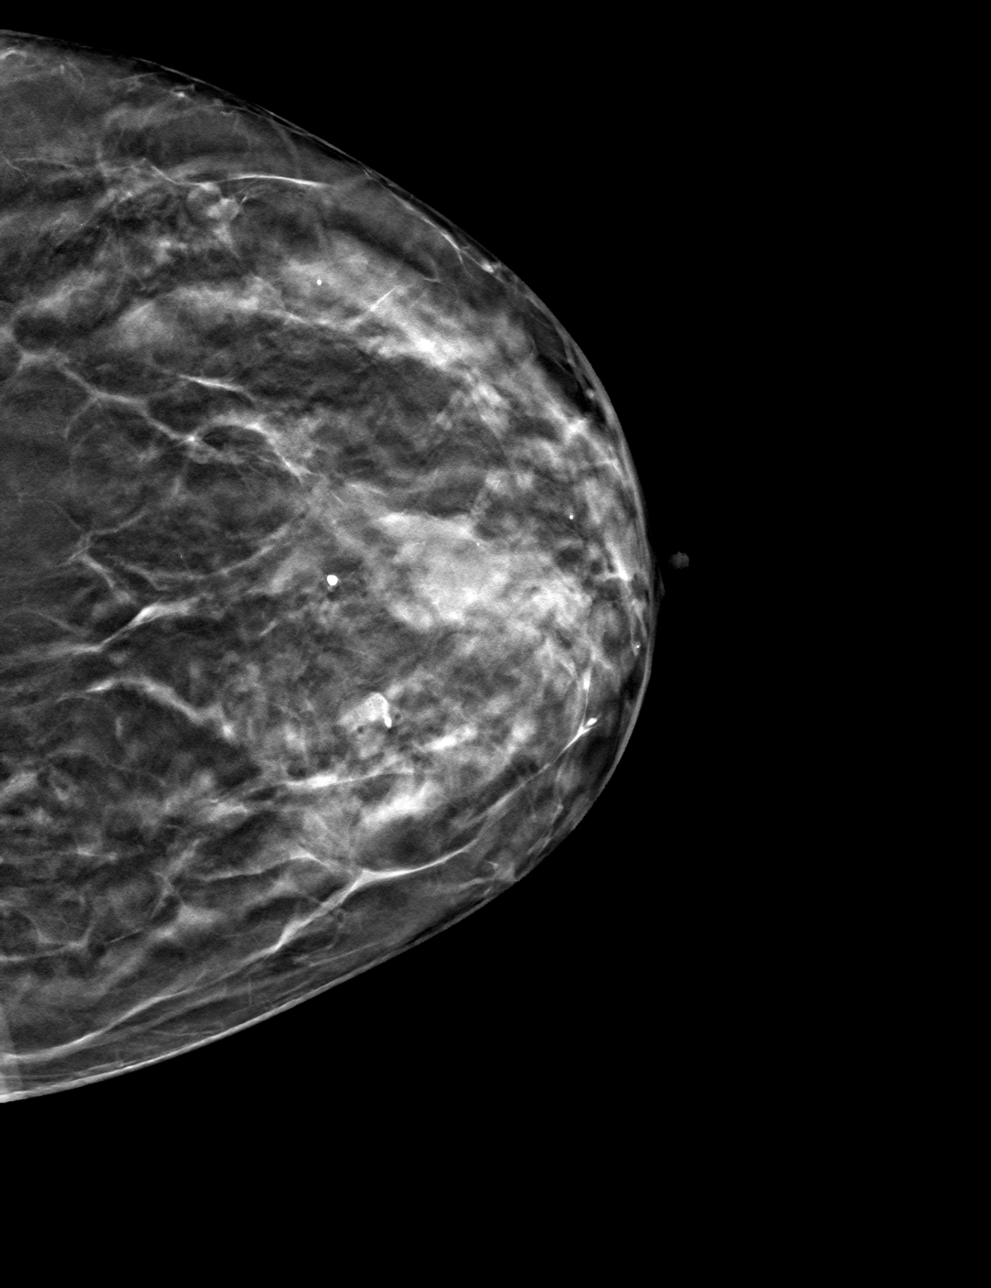

[L ML tomo · tomo slice 27/54.0]
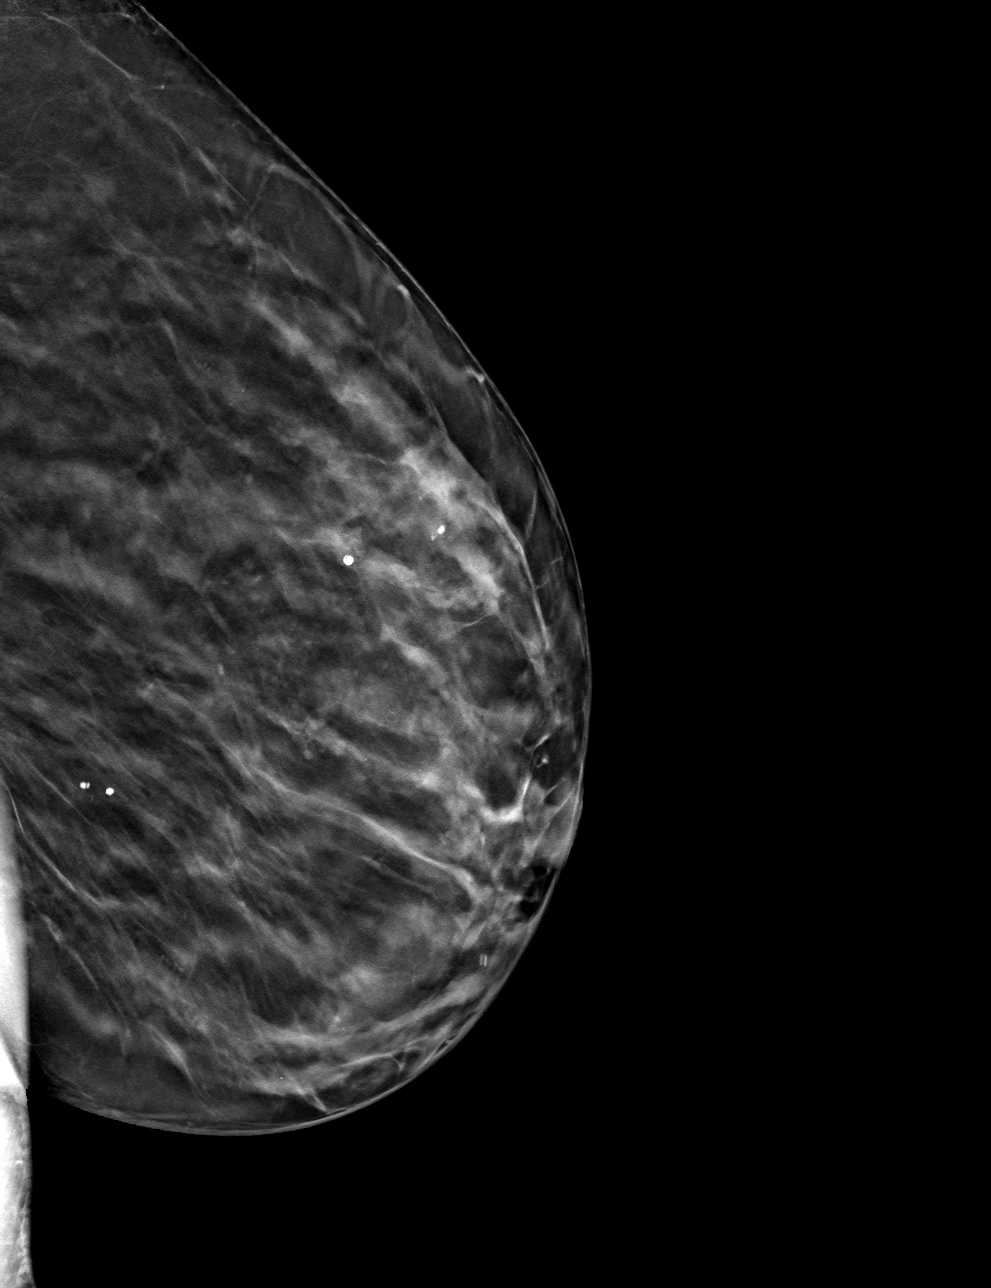

[4 of 12 positions shown; findings below may reference images not displayed]

FINDINGS: Tomosynthesis and synthesized full field CC and mediolateral images
were obtained following stereotactic tomosynthesis guided biopsy of
calcifications involving the UPPER INNER QUADRANT of the LEFT
breast. The coil shaped tissue marking clip is appropriately
positioned at the site of the biopsied calcifications in the UPPER
INNER QUADRANT at ANTERIOR depth.

Expected post biopsy changes are present without evidence of
hematoma.
IMPRESSION: Appropriate positioning of the coil shaped tissue marking clip at
the site of the biopsied calcifications in the UPPER INNER QUADRANT
of the LEFT breast at ANTERIOR depth.

Final Assessment: Post Procedure Mammograms for Marker Placement

## 2021-03-20 IMAGING — MG MM BREAST BX W LOC DEV 1ST LESION IMAGE BX SPEC STEREO GUIDE*L*
8 of 10 series · 8 of 22 positions shown · non-contrast
Comparison: Previous exams.
COMPARISON: Previous exams.

Addendum:
CLINICAL DATA: 54-year-old with a screening detected indeterminate
1.1 cm group of fine heterogeneous calcifications in the UPPER INNER
QUADRANT of the LEFT breast at ANTERIOR depth.

EXAM:
LEFT BREAST STEREOTACTIC CORE NEEDLE BIOPSY

[L (1 of 4)]
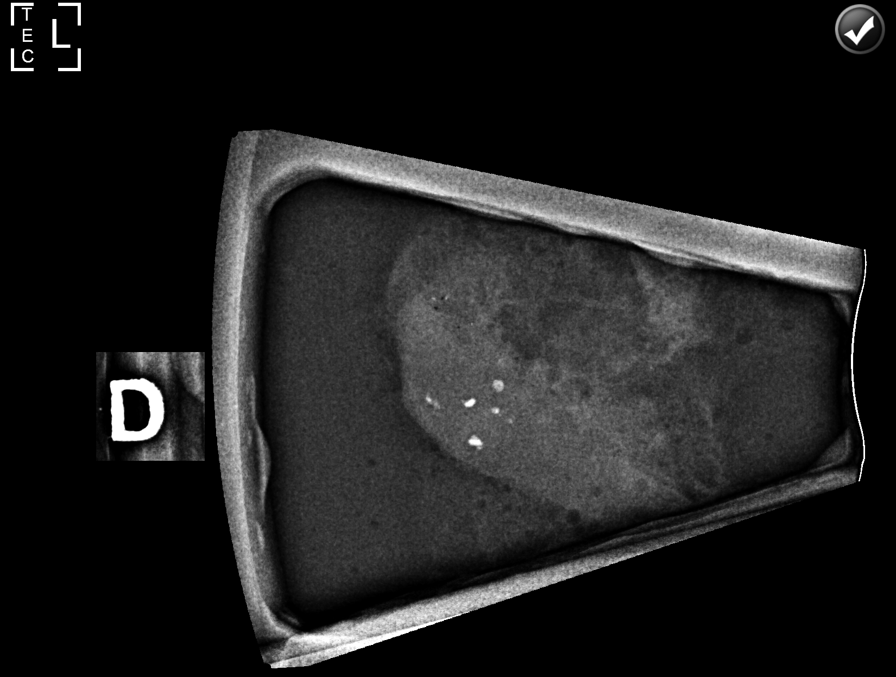

[L (2 of 4)]
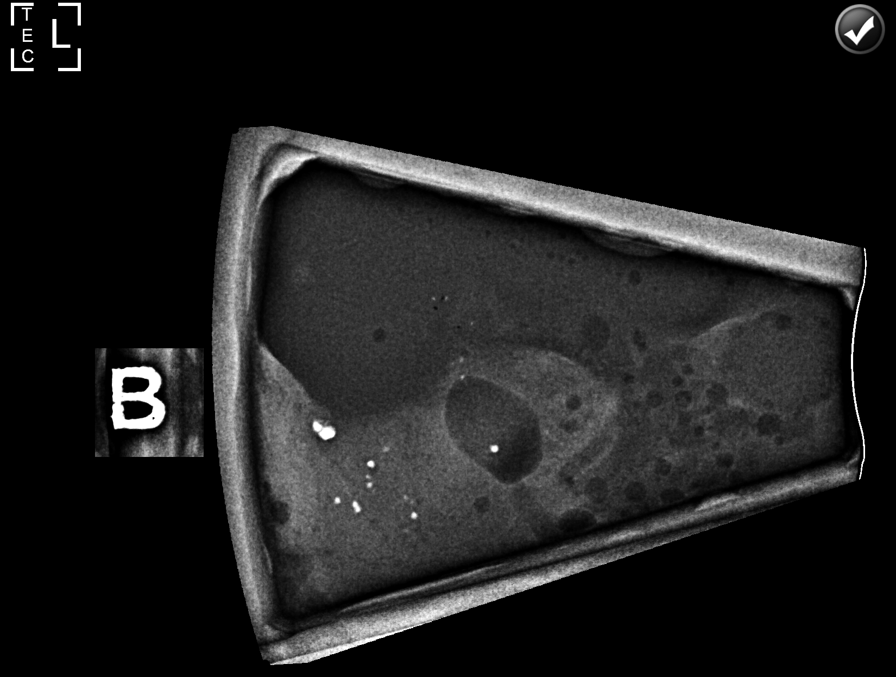

[L (3 of 4)]
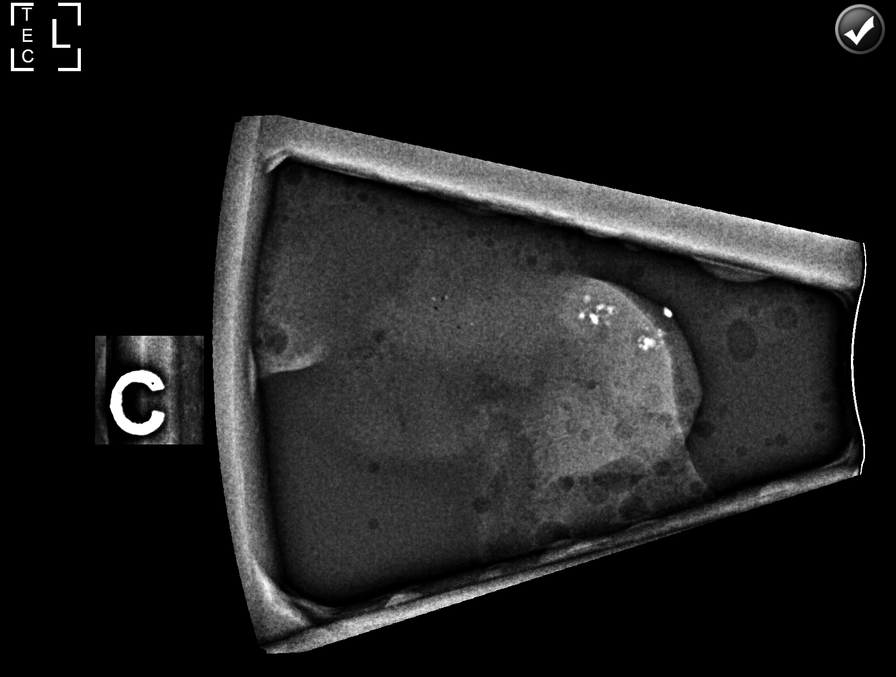

[L (4 of 4)]
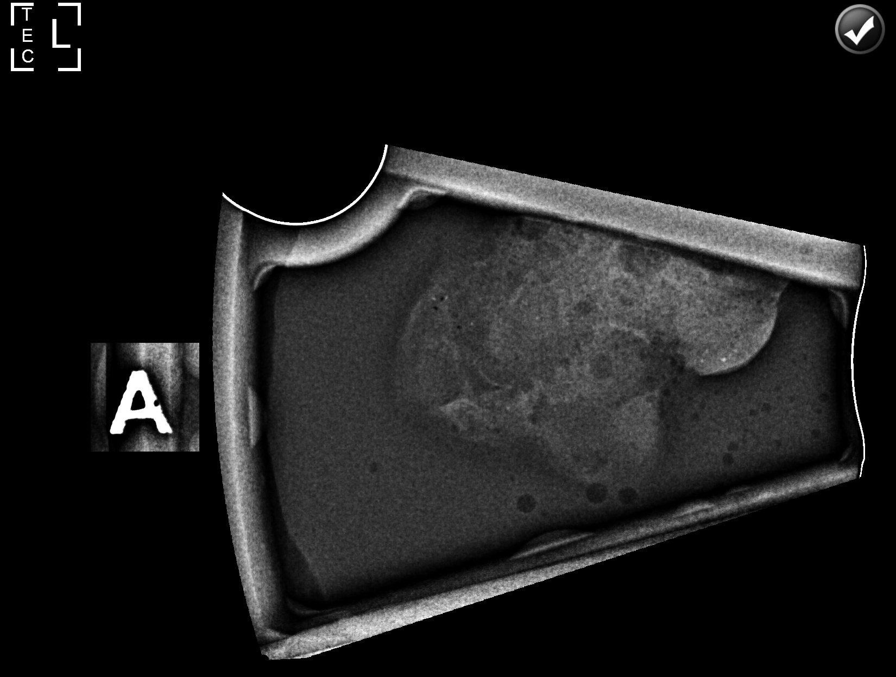

[L CC (1 of 3)]
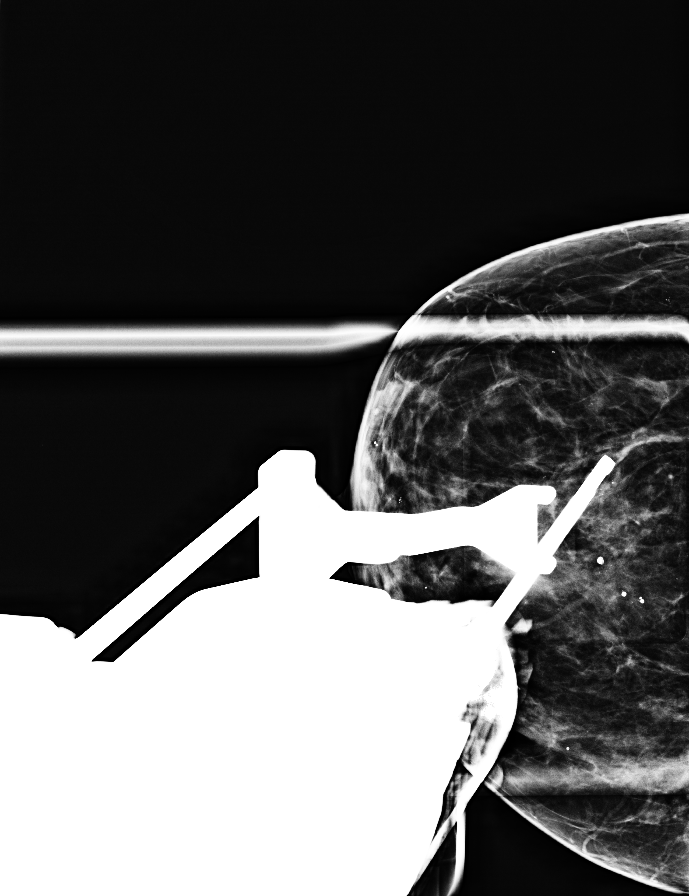

[L CC (2 of 3)]
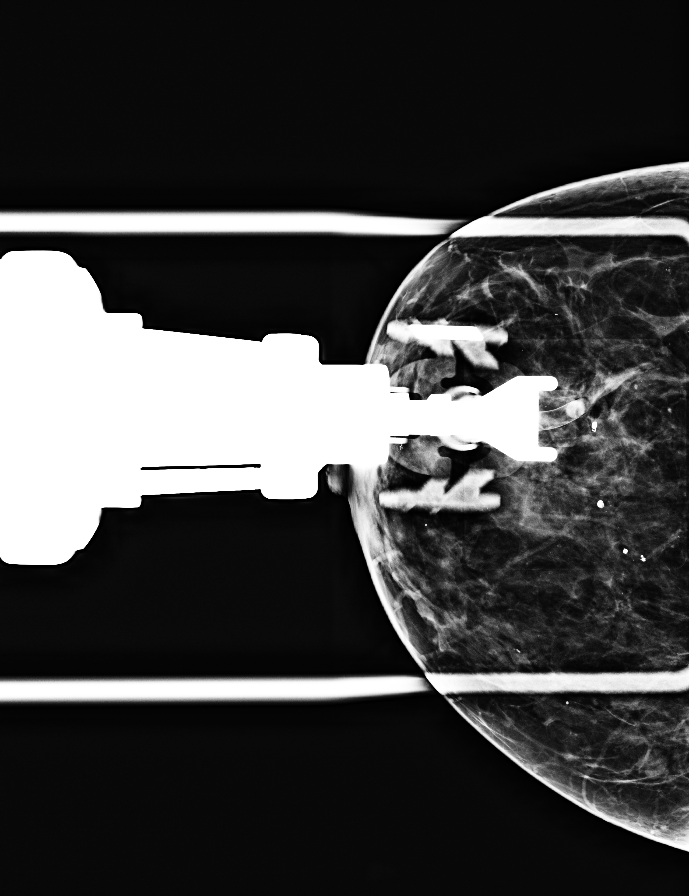

[L CC (3 of 3)]
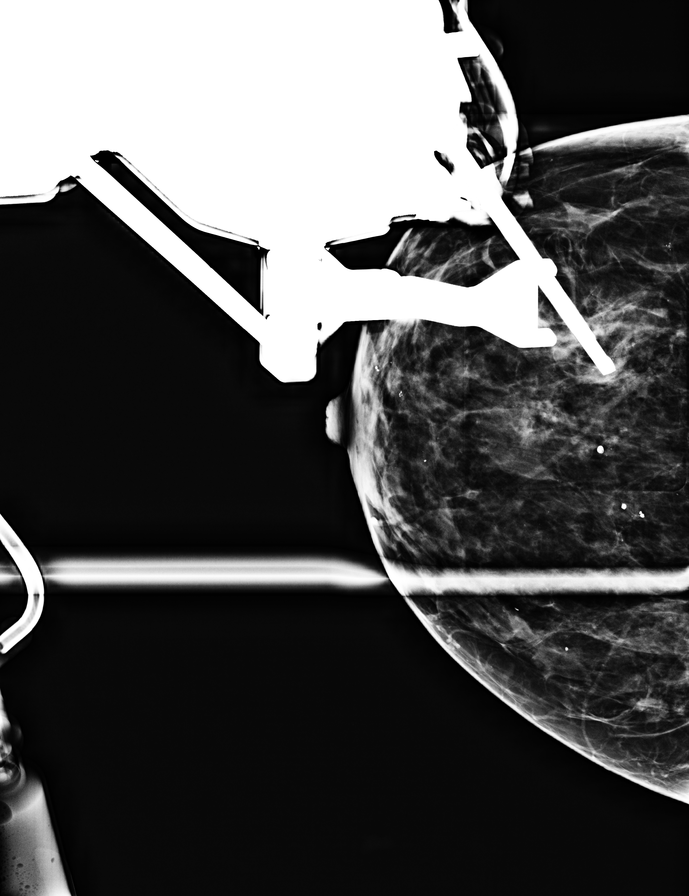

[L CC tomo · tomo slice 23/45.0]
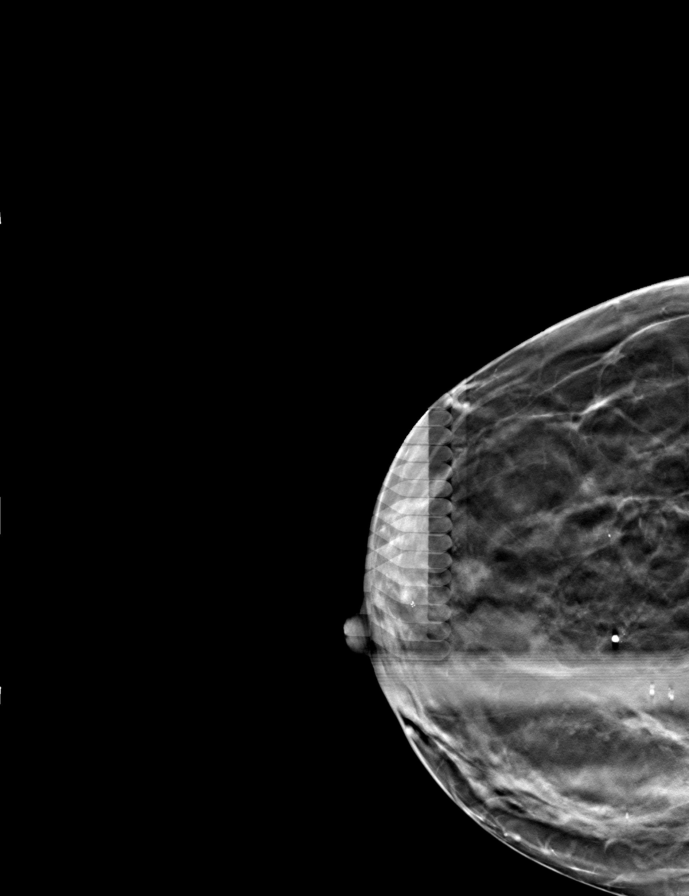

[8 of 22 positions shown; findings below may reference images not displayed]



Lesion quadrant: UPPER INNER QUADRANT.

Using sterile technique with chlorhexidine as skin antisepsis, 1%
lidocaine and 1% lidocaine with epinephrine as local anesthetic,
under stereotactic guidance, a 9 gauge Brevera vacuum assisted
device was used to perform core needle biopsy of calcifications
involving the UPPER INNER QUADRANT of the LEFT breast using a
superior approach. Specimen radiograph was performed showing
calcifications in all 4 of the core samples. Specimens with
calcifications are identified for pathology.

At the conclusion of the procedure, a coil shaped tissue marker clip
was deployed into the biopsy cavity. Follow-up 2-view mammogram was
performed and dictated separately.
IMPRESSION: Stereotactic-guided biopsy of an indeterminate 1.1 cm group
calcifications involving the UPPER INNER QUADRANT of the LEFT
breast. No apparent complications.

ADDENDUM:
Pathology revealed FIBROCYSTIC CHANGE WITH EVIDENCE OF RUPTURE.
COLUMNAR CELL CHANGE. MICROCALCIFICATIONS ARE ASSOCIATED WITH THESE
PROCESSES of the LEFT breast, upper inner quadrant, anterior depth.
This was found to be concordant by Dr. Jag Grimmer.

Pathology results were discussed with the patient by telephone. The
patient reported doing well after the biopsy with tenderness at the
site. Post biopsy instructions and care were reviewed and questions
were answered. The patient was encouraged to call The [REDACTED]

The patient was instructed to return for LEFT diagnostic mammography
to follow remaining calcifications in the upper inner quadrant in 6
months and was informed a reminder notice would be sent regarding
this appointment.

Pathology results reported by Orchel Euphemia RN on 03/05/2020.



Lesion quadrant: UPPER INNER QUADRANT.

Using sterile technique with chlorhexidine as skin antisepsis, 1%
lidocaine and 1% lidocaine with epinephrine as local anesthetic,
under stereotactic guidance, a 9 gauge Brevera vacuum assisted
device was used to perform core needle biopsy of calcifications
involving the UPPER INNER QUADRANT of the LEFT breast using a
superior approach. Specimen radiograph was performed showing
calcifications in all 4 of the core samples. Specimens with
calcifications are identified for pathology.

At the conclusion of the procedure, a coil shaped tissue marker clip
was deployed into the biopsy cavity. Follow-up 2-view mammogram was
performed and dictated separately.
IMPRESSION: Stereotactic-guided biopsy of an indeterminate 1.1 cm group
calcifications involving the UPPER INNER QUADRANT of the LEFT
breast. No apparent complications.
